# Patient Record
Sex: Male | Born: 1960 | Race: White | Hispanic: No | Marital: Married | Smoking: Former smoker
Health system: Southern US, Community
[De-identification: ages and names within clinical notes are randomized; demographics above are authoritative.]

## PROBLEM LIST (undated history)

## (undated) DIAGNOSIS — R011 Cardiac murmur, unspecified: Secondary | ICD-10-CM

## (undated) DIAGNOSIS — K219 Gastro-esophageal reflux disease without esophagitis: Secondary | ICD-10-CM

## (undated) HISTORY — PX: CYSTECTOMY: SUR359

## (undated) HISTORY — PX: EXTERNAL EAR SURGERY: SHX627

## (undated) HISTORY — PX: INNER EAR SURGERY: SHX679

---

## 2000-01-21 ENCOUNTER — Encounter: Payer: Self-pay | Admitting: Urology

## 2000-01-21 ENCOUNTER — Ambulatory Visit (HOSPITAL_COMMUNITY): Admission: RE | Admit: 2000-01-21 | Discharge: 2000-01-21 | Payer: Self-pay | Admitting: Urology

## 2004-11-04 ENCOUNTER — Ambulatory Visit (HOSPITAL_COMMUNITY): Admission: RE | Admit: 2004-11-04 | Discharge: 2004-11-04 | Payer: Self-pay | Admitting: Family Medicine

## 2004-11-09 ENCOUNTER — Ambulatory Visit (HOSPITAL_COMMUNITY): Admission: RE | Admit: 2004-11-09 | Discharge: 2004-11-09 | Payer: Self-pay | Admitting: Family Medicine

## 2004-12-31 ENCOUNTER — Observation Stay (HOSPITAL_COMMUNITY): Admission: EM | Admit: 2004-12-31 | Discharge: 2005-01-01 | Payer: Self-pay | Admitting: Emergency Medicine

## 2005-01-04 ENCOUNTER — Ambulatory Visit (HOSPITAL_COMMUNITY): Admission: RE | Admit: 2005-01-04 | Discharge: 2005-01-04 | Payer: Self-pay | Admitting: Family Medicine

## 2005-02-08 ENCOUNTER — Ambulatory Visit: Payer: Self-pay | Admitting: Internal Medicine

## 2005-02-10 ENCOUNTER — Ambulatory Visit (HOSPITAL_COMMUNITY): Admission: RE | Admit: 2005-02-10 | Discharge: 2005-02-10 | Payer: Self-pay | Admitting: Internal Medicine

## 2005-02-10 ENCOUNTER — Ambulatory Visit: Payer: Self-pay | Admitting: Internal Medicine

## 2005-02-11 ENCOUNTER — Ambulatory Visit (HOSPITAL_COMMUNITY): Admission: RE | Admit: 2005-02-11 | Discharge: 2005-02-11 | Payer: Self-pay | Admitting: Internal Medicine

## 2005-03-11 ENCOUNTER — Ambulatory Visit: Payer: Self-pay | Admitting: Internal Medicine

## 2006-02-10 ENCOUNTER — Ambulatory Visit (HOSPITAL_COMMUNITY): Admission: RE | Admit: 2006-02-10 | Discharge: 2006-02-10 | Payer: Self-pay | Admitting: Family Medicine

## 2006-09-07 ENCOUNTER — Ambulatory Visit (HOSPITAL_COMMUNITY): Admission: RE | Admit: 2006-09-07 | Discharge: 2006-09-07 | Payer: Self-pay | Admitting: Family Medicine

## 2007-10-11 ENCOUNTER — Ambulatory Visit (HOSPITAL_COMMUNITY): Admission: RE | Admit: 2007-10-11 | Discharge: 2007-10-11 | Payer: Self-pay | Admitting: Family Medicine

## 2008-05-15 ENCOUNTER — Ambulatory Visit (HOSPITAL_COMMUNITY): Admission: RE | Admit: 2008-05-15 | Discharge: 2008-05-15 | Payer: Self-pay | Admitting: Family Medicine

## 2008-05-28 ENCOUNTER — Encounter (HOSPITAL_COMMUNITY): Admission: RE | Admit: 2008-05-28 | Discharge: 2008-06-02 | Payer: Self-pay | Admitting: Family Medicine

## 2009-12-01 ENCOUNTER — Ambulatory Visit (HOSPITAL_COMMUNITY): Admission: RE | Admit: 2009-12-01 | Discharge: 2009-12-01 | Payer: Self-pay | Admitting: Family Medicine

## 2011-01-21 NOTE — Consult Note (Signed)
Jesse Evans, Jesse Evans              ACCOUNT NO.:  [id number]   MEDICAL RECORD NO.:  [id number]           PATIENT TYPE:  AMB   LOCATION:  DAY                           FACILITY:  [facility]   PHYSICIAN:  R. Roetta Sessions, M.D. DATE OF BIRTH:  [birth year]   DATE OF CONSULTATION:  02/08/2005  DATE OF DISCHARGE:                                   CONSULTATION   REQUESTING PHYSICIAN:  Kirk Ruths, M.D.   REASON FOR CONSULTATION:  Abdominal pain.   HISTORY OF PRESENT ILLNESS:  Mr. Lattin is a 50 year old gentleman with  several-month history of epigastric/right upper quadrant abdominal pain.  A  couple of months ago, the pain became more severe.  He was actually  hospitalized for further evaluation.  At that time he had abdominal  ultrasound which revealed splenic granulomas, no renal stones.  The common  bile duct was 4.4 mm in diameter.  Gallbladder appeared to be normal.  He  had a non-contrast CT which was unremarkable.  He has since had a negative  HIDA scan with gallbladder ejection fraction of 89%.  LFTs have been normal.  He has been noted to have some elevated glucose levels up into the 180s.  CBC was normal.  TSH normal at 0.398.  He says he has a history of  gastroesophageal reflux disease, but this seems to be well controlled.  He  is on Zantac 150 mg daily.  Recently was given a 3-week trial of Nexium, but  he noted no improvement of his symptoms.  He had an upper endoscopy about  eight or nine years ago in [place].  He was told he had esophageal  erosions.  He never really had typical heartburn symptoms.  He tends to get  burning in the epigastrium.  He has had some discomfort just right of the  epigastrium and in the right upper quadrant.  Sometimes this is related to  meals but not always.  It does not seem to be related to movement.  No  nausea or vomiting.  His bowel movements have been irregular.  He states  that he gets more frequent stools when he takes Zantac.  He has  occasionally  noted scant amount of red blood on the toilet tissue after having multiple  bowel movements.  Denies any melena.   CURRENT MEDICATIONS:  1.  Zantac 150 mg daily.  2.  Fenovent  LA capsules daily.  3.  Ibuprofen usually every other day for headache.   PAST MEDICAL HISTORY:  1.  History of heart murmur.  2.  History of sinusitis.  3.  History of headache.   PAST SURGICAL HISTORY:  1.  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  2.  He has had a pilonidal cyst removed.   FAMILY HISTORY:  Mother had breast cancer diagnosed two years ago, doing  well.  Paternal and maternal grandmothers and paternal sister and brother  all died of cancer, unknown type.   SOCIAL HISTORY:  He is married.  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  He smokes cigars now.  He quit smoking cigarettes five to six years ago.  He  stopped drinking alcohol 15 years ago.   REVIEW OF SYSTEMS:  He has fleeting left wall chest pain which lasts about  45 seconds and occurs once every couple of weeks.  He has had normal EKG per  his report.  Denies any shortness of breath or diaphoresis associated with  this. CONSTITUTIONAL:  Denies any weight loss.  GENITOURINARY:  Denies gross  hematuria or dysuria.   PHYSICAL EXAMINATION:  VITAL SIGNS:  Weight 171, height 5 feet 5-1/2 inches.  Temperature 98.2, blood pressure 120/78, pulse 84.  GENERAL:  Pleasant, well-nourished, well-developed Caucasian male in no  acute distress.  SKIN:  Warm and dry, no jaundice.  HEENT:  Pupils equal, round, and reactive to light. Conjunctivae pink.  Sclerae nonicteric.  Oropharyngeal mucosa moist and pink.  No lesion,  erythema, or exudate.  NECK:  No lymphadenopathy or thyromegaly.  CHEST:  Lungs clear to auscultation.  CARDIAC:  Regular rate and rhythm, normal S1 and S2.  No murmurs, rubs, or  gallops.  ABDOMEN:  Positive bowel sounds.  Soft,  nondistended.  He  has mild  epigastric/right upper quadrant tenderness to deep palpation.  No  organomegaly or masses.  No rebound tenderness or guarding.  Negative  Murphy's sign. No abdominal bruits or hernias.  EXTREMITIES:  No edema.   IMPRESSION:  The patient is a 50 year old gentleman with several-month  history of epigastric/right upper quadrant abdominal pain sometimes related  to meals.  He had an unremarkable HIDA scan and abdominal ultrasound with  normal liver function tests.  It appears that his symptoms are not related  to biliary etiology.  He does have a history of chronic gastroesophageal  reflux disease, although typical symptoms appear to be controlled.  Cannot  exclude peptic ulcer disease.  He has intermittent diarrhea which he feels  is related to Zantac.  Occasionally sees scant amount of bright red blood on  the toilet tissue.  The patient does not wish workup for this at this time.  He is concerned about the elevated glucose that he had during his  hospitalization.   PLAN:  1.  EGD in the near future.  2.  LFTs, MET-7, hemoglobin A1C.  3.  Stop Zantac.  Try PPI, either [place] or Aciphex whichever samples we      have available, 1 daily, #20 to be given.  4.  If upper endoscopy is unremarkable, would consider contrast CT as next      step.       LL/MEDQ  D:  02/08/2005  T:  02/08/2005  Job:  [id number]   cc:   Kirk Ruths, M.D.  [address]  [place]  [place] [address]  Fax: [phone]

## 2011-01-21 NOTE — H&P (Signed)
NAMEROSCOE, WITTS              ACCOUNT NO.:  [id number]   MEDICAL RECORD NO.:  [id number]          PATIENT TYPE:  INP   LOCATION:  A332                          FACILITY:  [facility]   PHYSICIAN:  Kirk Ruths, M.D.DATE OF BIRTH:  1961-06-10   DATE OF ADMISSION:  12/31/2004  DATE OF DISCHARGE:  LH                                HISTORY & PHYSICAL   CHIEF COMPLAINT:  Right-sided pain.   PRESENTING ILLNESS:  This is a 50 year old male who developed pain from his  right diaphragm down into his right leg on the day of admission. The patient  presented to the office where his vitals seemed normal. The patient had this  unexplainable pain with completely normal exam with only some mild abdominal  tenderness in the right upper quadrant with good distal pulses. The patient  in the office having had a normal exam, we were concerned about possible  aneurysm, and the etiology of his pain, so he was sent to the emergency room  where he was worked up with CT scan which was normal. His white count was  slightly elevated at 13.6, normal hemoglobin of 14.7, platelets and  electrolytes were normal although he does have a mildly elevated glucose  which is new. When in the emergency room, the patient's pain in the right  leg resolved, feeling it may have been related to his low back strain. He  did seem to have a little right upper and lower quadrant tenderness. He is  admitted for further evaluation of his pain, possibly gallbladder disease.   PAST MEDICAL HISTORY:  He has had surgery for a cyst and some ENT surgery.   MEDICATIONS:  He takes no medications except prednisone, Zantac.   REVIEW OF SYSTEMS:  Denies nausea, vomiting, chest pain, diaphoresis.   PHYSICAL EXAMINATION:  GENERAL:  Well developed, well nourished male who  appears fairly comfortable.  VITAL SIGNS:  Temperature 97.3, blood pressure 130/70, respirations 20,  pulse 86 and regular.  HEENT:  Tympanic membranes normal. Pupils  are equal, round, reactive to  light and accommodation. Oropharynx benign.  NECK:  Supple without jugular venous distention, bruit, or thyromegaly.  LUNGS:  Clear.  HEART:  Regular rate and rhythm without murmur, gallop, or rub.  ABDOMEN:  Soft with some mild right upper and right lower quadrant  tenderness. No rebound.  EXTREMITIES:  Without clubbing, cyanosis, or edema. Good distal pulses.  NEUROLOGIC:  Grossly intact.   ASSESSMENT:  1.  Abdominal pain of unknown etiology.      WMM/MEDQ  D:  01/01/2005  T:  01/01/2005  Job:  [id number]

## 2011-01-21 NOTE — Op Note (Signed)
Jesse Evans, LOUGHMILLER              ACCOUNT NO.:  [id number]   MEDICAL RECORD NO.:  [id number]          PATIENT TYPE:  AMB   LOCATION:  DAY                           FACILITY:  [facility]   PHYSICIAN:  R. Roetta Sessions, M.D. DATE OF BIRTH:  11-Jan-1961   DATE OF PROCEDURE:  02/10/2005  DATE OF DISCHARGE:                                 OPERATIVE REPORT   INDICATIONS FOR PROCEDURE:  A 50 year old gentleman with several month  history of intermittent epigastric and right upper quadrant abdominal pain,  sometimes related to meal, sometimes not. Gallbladder ultrasound and HIDA  negative. He was started on Aciphex 20 mg orally daily back on February 08, 2005.  Cannot tell at this point whether it is making much difference. EGD is now  being done to further evaluate his symptoms. Approach has been discussed  with patient at length. Potential risks, benefits, and alternatives have  been reviewed and questions answered. He is agreeable. Please see  documentation in the medical record.   PROCEDURE NOTE:  O2 saturation, blood pressure, pulse, and respirations were  monitored throughout the entire procedure. Conscious sedation with Versed 3  mg IV and Demerol 50 mg IV in divided doses. Cetacaine spray for topical  oropharyngeal anesthesia.   INSTRUMENT:  Olympus video chip system.   FINDINGS:  Examination of the tubular esophagus revealed somewhat patulous  EG junction and a couple of tiny distal esophageal erosions. Esophageal  mucosa otherwise appeared normal. EG junction easily traversed.   Stomach:  Gastric cavity was empty and insufflated well with air. Thorough  examination of gastric mucosa including retroflexed view of the proximal  stomach and esophagogastric junction demonstrated a couple of tiny antral  erosions. Otherwise the gastric mucosa appeared entirely normal. Pylorus was  patent and easily traversed. Examination of bulb and second portion revealed  no abnormalities.   THERAPEUTIC/DIAGNOSTIC MANEUVERS:  None.   IMPRESSION:  A couple of tiny distal esophageal erosions, patulous  esophagogastric junction, otherwise normal esophagus. A couple of tiny  antral erosions, otherwise normal stomach, normal D1 and D2.   Today's endoscopy does not explain the patient's symptoms. He may have an  element of gastroesophageal reflux. I examined him again today, and he has  much tenderness over his right lower anterior rib cage than he does right  upper abdominal quadrant. Labs from February 08, 2005:  Liver profile entirely  normal. MET7 okay except for slightly suppressed sodium of 133. Hemoglobin  A1c 5.6.   RECOMMENDATIONS:  Continue Aciphex 20 mg orally daily. Will go ahead and  proceed with abdominal and chest CT scan with IV normal contrast to further  evaluate his symptoms. Further recommendations to follow.   ADDENDUM:  Mild gastric erosions may likely be ibuprofen effect. Further  recommendations to follow.       RMR/MEDQ  D:  02/10/2005  T:  02/10/2005  Job:  [id number]   cc:   Kirk Ruths, M.D.  [address]  [place]  [place] [address]  Fax: [phone]

## 2012-03-01 ENCOUNTER — Telehealth: Payer: Self-pay | Admitting: Internal Medicine

## 2012-03-01 NOTE — Telephone Encounter (Signed)
Patient's wife Misty Stanley) called to set up TCS for her husband, He is having no problems and wants to be triaged. Please call her at [phone]

## 2012-03-01 NOTE — Telephone Encounter (Signed)
LMOM to call.

## 2012-03-05 NOTE — Telephone Encounter (Signed)
Called and spoke to wife Misty Stanley. Pt had EGD 02/10/2005 by RMR. He needs his first colonoscopy. Not having any problems. Wants a Friday as late as possible. I made her aware that he will need the day before off to do the prep and the day of to do the procedure. Pt cannot do on 03/09/2012. Dr. Jena Gauss unavailable the remainder of the month on Fridays. I do not have the August schedule. Will call Misty Stanley back when I get the August schedule.

## 2012-03-20 ENCOUNTER — Telehealth: Payer: Self-pay

## 2012-03-20 ENCOUNTER — Other Ambulatory Visit: Payer: Self-pay

## 2012-03-20 DIAGNOSIS — Z139 Encounter for screening, unspecified: Secondary | ICD-10-CM

## 2012-03-20 NOTE — Telephone Encounter (Signed)
Gastroenterology Pre-Procedure Form     Request Date: 05/04/2012     Requesting Physician: Dr. Regino Schultze     PATIENT INFORMATION:  Jesse Evans is a 51 y.o., male (DOB=[birth year]).  PROCEDURE: Procedure(s) requested: colonoscopy Procedure Reason: screening for colon cancer  PATIENT REVIEW QUESTIONS: The patient reports the following:   1. Diabetes Melitis: no 2. Joint replacements in the past 12 months: no 3. Major health problems in the past 3 months: no 4. Has an artificial valve or MVP:no 5. Has been advised in past to take antibiotics in advance of a procedure like teeth cleaning: no}    MEDICATIONS & ALLERGIES:    Patient reports the following regarding taking any blood thinners:   Plavix? no Aspirin?yes  Coumadin?  no  Patient confirms/reports the following medications:  Current Outpatient Prescriptions  Medication Sig Dispense Refill  . ibuprofen (ADVIL,MOTRIN) 200 MG tablet Take 200 mg by mouth every 6 (six) hours as needed. Only as needed      . ranitidine (ZANTAC) 150 MG tablet Take 150 mg by mouth 2 (two) times daily.       Pt takes ASA 325 mg   One tablet every other day  Patient confirms/reports the following allergies:  No Known Allergies  Patient is appropriate to schedule for requested procedure(s): yes  AUTHORIZATION INFORMATION Primary Insurance:   ID #:   Group #:  Pre-Cert / Auth required:  Pre-Cert / Auth #:   Secondary Insurance:   ID #:   Group #:  Pre-Cert / Auth required: Pre-Cert / Auth #:   No orders of the defined types were placed in this encounter.    SCHEDULE INFORMATION: Procedure has been scheduled as follows:  Date: 05/04/2012     Time: 8:15 AM  Location: [facility] Short Stay  This Gastroenterology Pre-Precedure Form is being routed to the following provider(s) for review: R. Roetta Sessions, MD

## 2012-03-20 NOTE — Telephone Encounter (Signed)
Pt is scheduled for 05/04/2012 at 8:15 with RMR. See separate triage phone call.

## 2012-03-20 NOTE — Telephone Encounter (Signed)
Rx and instructions mailed to pt.  

## 2012-03-20 NOTE — Telephone Encounter (Signed)
OK to schedule

## 2012-04-16 ENCOUNTER — Telehealth: Payer: Self-pay

## 2012-04-16 NOTE — Telephone Encounter (Signed)
LMOM for pt or wife, Misty Stanley to call to update triage. ( Scheduled for 05/04/2012 at 8:15 AM with RMR.

## 2012-04-16 NOTE — Telephone Encounter (Signed)
Pt's wife called back. Please return her call. Thanks.

## 2012-04-17 NOTE — Telephone Encounter (Signed)
Called and spoke with pt's wife to update triage. He has not had any new problems and no change in medications. He is scheduled for 05/04/2012 @ 8:15 AM with RMR for colonoscopy. He has received his prescription and instructions.

## 2012-04-17 NOTE — Telephone Encounter (Signed)
OK to schedule

## 2012-04-18 ENCOUNTER — Encounter (HOSPITAL_COMMUNITY): Payer: Self-pay | Admitting: Pharmacy Technician

## 2012-05-04 ENCOUNTER — Ambulatory Visit (HOSPITAL_COMMUNITY)
Admission: RE | Admit: 2012-05-04 | Discharge: 2012-05-04 | Disposition: A | Discharge status: Home / Self Care | Payer: PRIVATE HEALTH INSURANCE | Source: Ambulatory Visit | Attending: Internal Medicine | Admitting: Internal Medicine

## 2012-05-04 ENCOUNTER — Encounter (HOSPITAL_COMMUNITY)
Admission: RE | Disposition: A | Discharge status: Home / Self Care | Payer: Self-pay | Source: Ambulatory Visit | Attending: Internal Medicine

## 2012-05-04 ENCOUNTER — Encounter (HOSPITAL_COMMUNITY): Payer: Self-pay | Admitting: *Deleted

## 2012-05-04 DIAGNOSIS — Z139 Encounter for screening, unspecified: Secondary | ICD-10-CM

## 2012-05-04 DIAGNOSIS — Z1211 Encounter for screening for malignant neoplasm of colon: Secondary | ICD-10-CM | POA: Insufficient documentation

## 2012-05-04 DIAGNOSIS — K648 Other hemorrhoids: Secondary | ICD-10-CM

## 2012-05-04 HISTORY — PX: COLONOSCOPY: SHX5424

## 2012-05-04 HISTORY — DX: Gastro-esophageal reflux disease without esophagitis: K21.9

## 2012-05-04 HISTORY — DX: Cardiac murmur, unspecified: R01.1

## 2012-05-04 SURGERY — COLONOSCOPY
Anesthesia: Moderate Sedation

## 2012-05-04 MED ORDER — MIDAZOLAM HCL 5 MG/5ML IJ SOLN
INTRAMUSCULAR | Status: DC | PRN
  Administered 2012-05-04 (×2): 2 mg via INTRAVENOUS

## 2012-05-04 MED ORDER — MEPERIDINE HCL 100 MG/ML IJ SOLN
INTRAMUSCULAR | Status: DC | PRN
  Administered 2012-05-04: 25 mg via INTRAVENOUS
  Administered 2012-05-04: 50 mg via INTRAVENOUS

## 2012-05-04 MED ORDER — MEPERIDINE HCL 100 MG/ML IJ SOLN
INTRAMUSCULAR | Status: AC
  Filled 2012-05-04: qty 2

## 2012-05-04 MED ORDER — MIDAZOLAM HCL 5 MG/5ML IJ SOLN
INTRAMUSCULAR | Status: AC
  Filled 2012-05-04: qty 10

## 2012-05-04 MED ORDER — STERILE WATER FOR IRRIGATION IR SOLN
Status: DC | PRN
  Administered 2012-05-04: 09:00:00

## 2012-05-04 MED ORDER — SODIUM CHLORIDE 0.45 % IV SOLN
Freq: Once | INTRAVENOUS | Status: AC
  Administered 2012-05-04: 1000 mL via INTRAVENOUS

## 2012-05-04 NOTE — Op Note (Signed)
[facility] [address] [place], [address]   COLONOSCOPY PROCEDURE REPORT  PATIENT: Jesse Evans, Jesse Evans  MR#:         [id number] BIRTHDATE: [birth year] , 51  yrs. old GENDER: Male ENDOSCOPIST: R.  Roetta Sessions, MD FACP FACG REFERRED BY:  Karleen Hampshire, M.D. PROCEDURE DATE:  05/04/2012 PROCEDURE:     screening ileocolonoscopy  INDICATIONS: first ever average risk screening examination  INFORMED CONSENT:  The risks, benefits, alternatives and imponderables including but not limited to bleeding, perforation as well as the possibility of a missed lesion have been reviewed.  The potential for biopsy, lesion removal, etc. have also been discussed.  Questions have been answered.  All parties agreeable. Please see the history and physical in the medical record for more information.  MEDICATIONS: Versed 4 mg IV and Demerol 75 mg IV in divided doses.  DESCRIPTION OF PROCEDURE:  After a digital rectal exam was performed, the Pentax Colonoscope [id number]  colonoscope was advanced from the anus through the rectum and colon to the area of the cecum, ileocecal valve and appendiceal orifice.  The cecum was deeply intubated.  These structures were well-seen and photographed for the record.  From the level of the cecum and ileocecal valve, the scope was slowly and cautiously withdrawn.  The mucosal surfaces were carefully surveyed utilizing scope tip deflection to facilitate fold flattening as needed.  The scope was pulled down into the rectum where a thorough examination including retroflexion was performed.    FINDINGS:  adequate preparation. Internal hemorrhoids; otherwise, normal rectum. Normal appearing colonic mucosa. normal appearing distal 10 cm of terminal ileal mucosa.  THERAPEUTIC / DIAGNOSTIC MANEUVERS PERFORMED:  none  COMPLICATIONS: none  CECAL WITHDRAWAL TIME:  12 minutes  IMPRESSION:  internal hemorrhoids; otherwise normal rectum colon and terminal  ileum.  RECOMMENDATIONS: repeat screening colonoscopy in 10 years.   _______________________________ eSigned:  R. Roetta Sessions, MD FACP [facility] 05/04/2012 9:07 AM   CC:

## 2012-05-04 NOTE — H&P (Signed)
  Primary Care Physician:  Kirk Ruths, MD Primary Gastroenterologist:  Dr. Jena Gauss  Pre-Procedure History & Physical: HPI:  Jesse Evans is a 52 y.o. male is here for a screening colonoscopy. No bowel symptoms. No family history of colon polyps or colon cancer. No prior colonoscopy.  Past Medical History  Diagnosis Date  . GERD (gastroesophageal reflux disease)   . Heart murmur     Past Surgical History  Procedure Date  . Inner ear surgery     As a child    Prior to Admission medications   Medication Sig Start Date End Date Taking? Authorizing Provider  ibuprofen (ADVIL,MOTRIN) 200 MG tablet Take 200 mg by mouth every 6 (six) hours as needed. Only as needed   Yes Historical Provider, MD  aspirin 325 MG tablet Take 325 mg by mouth daily. Pt takes one tablet every other day    Historical Provider, MD  ranitidine (ZANTAC) 150 MG tablet Take 150 mg by mouth 2 (two) times daily.    Historical Provider, MD    Allergies as of 03/20/2012  . (No Known Allergies)    History reviewed. No pertinent family history.  History   Social History  . Marital Status: Married    Spouse Name: N/A    Number of Children: N/A  . Years of Education: N/A   Occupational History  . Not on file.   Social History Main Topics  . Smoking status: Former Smoker -- 1.0 packs/day for 18 years    Types: Cigarettes  . Smokeless tobacco: Not on file  . Alcohol Use: No  . Drug Use: No  . Sexually Active:    Other Topics Concern  . Not on file   Social History Narrative  . No narrative on file    Review of Systems: See HPI, otherwise negative ROS  Physical Exam: BP 126/86  Pulse 69  Temp 97.7 F (36.5 C) (Oral)  Resp 18  Ht 5\' 5"  (1.651 m)  Wt 165 lb (74.844 kg)  BMI 27.46 kg/m2  SpO2 98% General:   Alert,  Well-developed, well-nourished, pleasant and cooperative in NAD Head:  Normocephalic and atraumatic. Eyes:  Sclera clear, no icterus.   Conjunctiva pink. Ears:  Normal  auditory acuity. Nose:  No deformity, discharge,  or lesions. Mouth:  No deformity or lesions, dentition normal. Neck:  Supple; no masses or thyromegaly. Lungs:  Clear throughout to auscultation.   No wheezes, crackles, or rhonchi. No acute distress. Heart:  Regular rate and rhythm; no murmurs, clicks, rubs,  or gallops. Abdomen:  Soft, nontender and nondistended. No masses, hepatosplenomegaly or hernias noted. Normal bowel sounds, without guarding, and without rebound.   Msk:  Symmetrical without gross deformities. Normal posture. Pulses:  Normal pulses noted. Extremities:  Without clubbing or edema. Neurologic:  Alert and  oriented x4;  grossly normal neurologically. Skin:  Intact without significant lesions or rashes. Cervical Nodes:  No significant cervical adenopathy. Psych:  Alert and cooperative. Normal mood and affect.  Impression/Plan: Jesse Evans is now here to undergo a screening colonoscopy. First ever average risk screening examination  Risks, benefits, limitations, imponderables and alternatives regarding colonoscopy have been reviewed with the patient. Questions have been answered. All parties agreeable.

## 2012-05-11 ENCOUNTER — Encounter (HOSPITAL_COMMUNITY): Payer: Self-pay | Admitting: Internal Medicine

## 2012-07-15 ENCOUNTER — Encounter (HOSPITAL_COMMUNITY): Payer: Self-pay | Admitting: Emergency Medicine

## 2012-07-15 ENCOUNTER — Emergency Department (HOSPITAL_COMMUNITY)
Admission: EM | Admit: 2012-07-15 | Discharge: 2012-07-15 | Disposition: A | Discharge status: Home / Self Care | Payer: PRIVATE HEALTH INSURANCE | Attending: Emergency Medicine | Admitting: Emergency Medicine

## 2012-07-15 DIAGNOSIS — Z79899 Other long term (current) drug therapy: Secondary | ICD-10-CM | POA: Insufficient documentation

## 2012-07-15 DIAGNOSIS — Z87891 Personal history of nicotine dependence: Secondary | ICD-10-CM | POA: Insufficient documentation

## 2012-07-15 DIAGNOSIS — R011 Cardiac murmur, unspecified: Secondary | ICD-10-CM | POA: Insufficient documentation

## 2012-07-15 DIAGNOSIS — Y929 Unspecified place or not applicable: Secondary | ICD-10-CM | POA: Insufficient documentation

## 2012-07-15 DIAGNOSIS — Z7982 Long term (current) use of aspirin: Secondary | ICD-10-CM | POA: Insufficient documentation

## 2012-07-15 DIAGNOSIS — K219 Gastro-esophageal reflux disease without esophagitis: Secondary | ICD-10-CM | POA: Insufficient documentation

## 2012-07-15 DIAGNOSIS — IMO0002 Reserved for concepts with insufficient information to code with codable children: Secondary | ICD-10-CM | POA: Insufficient documentation

## 2012-07-15 DIAGNOSIS — Y9389 Activity, other specified: Secondary | ICD-10-CM | POA: Insufficient documentation

## 2012-07-15 DIAGNOSIS — S0001XA Abrasion of scalp, initial encounter: Secondary | ICD-10-CM

## 2012-07-15 MED ORDER — METHOCARBAMOL 500 MG PO TABS: 1000.0000 mg | ORAL_TABLET | Freq: Four times a day (QID) | ORAL | Status: AC | PRN
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
## 2012-07-18 NOTE — ED Provider Notes (Signed)
Medical screening examination/treatment/procedure(s) were performed by non-physician practitioner and as supervising physician I was immediately available for consultation/collaboration.   Laray Anger, DO 07/18/12 1326

## 2015-05-26 ENCOUNTER — Emergency Department (HOSPITAL_COMMUNITY)
Admission: EM | Admit: 2015-05-26 | Discharge: 2015-05-26 | Disposition: A | Discharge status: Home / Self Care | Payer: 59 | Attending: Emergency Medicine | Admitting: Emergency Medicine

## 2015-05-26 ENCOUNTER — Encounter (HOSPITAL_COMMUNITY): Payer: Self-pay | Admitting: Emergency Medicine

## 2015-05-26 DIAGNOSIS — Z87891 Personal history of nicotine dependence: Secondary | ICD-10-CM | POA: Diagnosis not present

## 2015-05-26 DIAGNOSIS — J039 Acute tonsillitis, unspecified: Secondary | ICD-10-CM | POA: Diagnosis not present

## 2015-05-26 DIAGNOSIS — R011 Cardiac murmur, unspecified: Secondary | ICD-10-CM | POA: Insufficient documentation

## 2015-05-26 DIAGNOSIS — Z7982 Long term (current) use of aspirin: Secondary | ICD-10-CM | POA: Insufficient documentation

## 2015-05-26 DIAGNOSIS — K219 Gastro-esophageal reflux disease without esophagitis: Secondary | ICD-10-CM | POA: Insufficient documentation

## 2015-05-26 DIAGNOSIS — M791 Myalgia: Secondary | ICD-10-CM | POA: Insufficient documentation

## 2015-05-26 DIAGNOSIS — Z79899 Other long term (current) drug therapy: Secondary | ICD-10-CM | POA: Insufficient documentation

## 2015-05-26 DIAGNOSIS — R51 Headache: Secondary | ICD-10-CM | POA: Diagnosis present

## 2015-05-26 MED ORDER — PENICILLIN V POTASSIUM 125 MG/5ML PO SOLR: 500.0000 mg | Freq: Four times a day (QID) | ORAL | Status: AC

## 2015-05-26 NOTE — ED Notes (Addendum)
Pt reports headache,right ear pain,sore throat since yesterday. Pt reports diarrhea since this am. Pt denies any known fevers.

## 2015-05-26 NOTE — ED Provider Notes (Signed)
CSN: [id number]     Arrival date & time 05/26/15  1640 History   First MD Initiated Contact with Patient 05/26/15 1919     Chief Complaint  Patient presents with  . Headache     (Consider location/radiation/quality/duration/timing/severity/associated sxs/prior Treatment) HPI   Jesse Evans is a 54 y.o. male who presents for evaluation of sore throat for 3 days with pain radiating to the right ear, malaise and myalgia. No weakness, dizziness, nausea, vomiting, cough, chest pain or back pain.     Past Medical History  Diagnosis Date  . GERD (gastroesophageal reflux disease)   . Heart murmur    Past Surgical History  Procedure Laterality Date  . Inner ear surgery      As a child  . Colonoscopy  05/04/2012    Procedure: COLONOSCOPY;  Surgeon: Corbin Ade, MD;  [facility];  Service: Endoscopy;  Laterality: N/A;  8:15 AM  . External ear surgery    . Cystectomy     History reviewed. No pertinent family history. Social History  Substance Use Topics  . Smoking status: Former Smoker -- 1.00 packs/day for 18 years    Types: Cigarettes  . Smokeless tobacco: None  . Alcohol Use: No    Review of Systems  All other systems reviewed and are negative.     Allergies  Review of patient's allergies indicates no known allergies.  Home Medications   Prior to Admission medications   Medication Sig Start Date End Date Taking? Authorizing Provider  aspirin 325 MG tablet Take 325 mg by mouth daily. Pt takes one tablet every other day    Historical Provider, MD  ibuprofen (ADVIL,MOTRIN) 200 MG tablet Take 200 mg by mouth every 6 (six) hours as needed. Only as needed(for pain)    Historical Provider, MD  ranitidine (ZANTAC) 150 MG tablet Take 150 mg by mouth 2 (two) times daily.    Historical Provider, MD   BP 148/78 mmHg  Pulse 93  Temp(Src) 98.1 F (36.7 C) (Oral)  Resp 18  Ht 5' 5.5" (1.664 m)  Wt 190 lb (86.183 kg)  BMI 31.13 kg/m2  SpO2 100% Physical Exam   Constitutional: He is oriented to person, place, and time. He appears well-developed and well-nourished. No distress.  HENT:  Head: Normocephalic and atraumatic.  Right Ear: External ear normal.  Left Ear: External ear normal.  Bilateral tonsillar hypertrophy with exudate. No deviation of the uvula. No trismus.  Eyes: Conjunctivae and EOM are normal. Pupils are equal, round, and reactive to light.  Neck: Normal range of motion and phonation normal. Neck supple.  Cardiovascular: Normal rate, regular rhythm and normal heart sounds.   Pulmonary/Chest: Effort normal and breath sounds normal. He exhibits no bony tenderness.  Abdominal: Soft. There is no tenderness.  Musculoskeletal: Normal range of motion.  Neurological: He is alert and oriented to person, place, and time. No cranial nerve deficit or sensory deficit. He exhibits normal muscle tone. Coordination normal.  Skin: Skin is warm, dry and intact.  Psychiatric: He has a normal mood and affect. His behavior is normal. Judgment and thought content normal.  Nursing note and vitals reviewed.   ED Course  Procedures (including critical care time) Findings discussed with patient and wife, all questions were answered.  Labs Review Labs Reviewed - No data to display  Imaging Review No results found. I have personally reviewed and evaluated these images and lab results as part of my medical decision-making.   EKG  Interpretation None      MDM   Final diagnoses:  Tonsillitis      Evaluation is consistent with streptococcal tonsillitis. Doubt systemic illness.  Nursing Notes Reviewed/ Care Coordinated Applicable Imaging Reviewed Interpretation of Laboratory Data incorporated into ED treatment  The patient appears reasonably screened and/or stabilized for discharge and I doubt any other medical condition or other [facility] requiring further screening, evaluation, or treatment in the ED at this time prior to discharge.  Plan: Home  Medications- Pen V; Home Treatments- rest; return here if the recommended treatment, does not improve the symptoms; Recommended follow up- PCP prn     Mancel Bale, MD 05/26/15 1931

## 2015-05-26 NOTE — Discharge Instructions (Signed)

## 2017-11-16 ENCOUNTER — Ambulatory Visit (HOSPITAL_COMMUNITY)
Admission: RE | Admit: 2017-11-16 | Discharge: 2017-11-16 | Disposition: A | Discharge status: Home / Self Care | Payer: 59 | Source: Ambulatory Visit | Attending: Physician Assistant | Admitting: Physician Assistant

## 2017-11-16 ENCOUNTER — Other Ambulatory Visit (HOSPITAL_COMMUNITY): Payer: Self-pay | Admitting: Physician Assistant

## 2017-11-16 DIAGNOSIS — R0602 Shortness of breath: Secondary | ICD-10-CM | POA: Diagnosis not present

## 2020-07-09 ENCOUNTER — Other Ambulatory Visit (HOSPITAL_COMMUNITY): Payer: Self-pay | Admitting: Physician Assistant

## 2020-07-09 DIAGNOSIS — R0789 Other chest pain: Secondary | ICD-10-CM

## 2020-07-09 DIAGNOSIS — R0609 Other forms of dyspnea: Secondary | ICD-10-CM

## 2020-07-13 ENCOUNTER — Other Ambulatory Visit: Payer: Self-pay

## 2020-07-13 ENCOUNTER — Ambulatory Visit (HOSPITAL_COMMUNITY)
Admission: RE | Admit: 2020-07-13 | Discharge: 2020-07-13 | Disposition: A | Discharge status: Home / Self Care | Payer: 59 | Source: Ambulatory Visit | Attending: Physician Assistant | Admitting: Physician Assistant

## 2020-07-13 DIAGNOSIS — R0609 Other forms of dyspnea: Secondary | ICD-10-CM | POA: Diagnosis present

## 2020-07-13 DIAGNOSIS — R0789 Other chest pain: Secondary | ICD-10-CM | POA: Diagnosis not present

## 2020-08-12 ENCOUNTER — Ambulatory Visit: Payer: PRIVATE HEALTH INSURANCE | Admitting: Internal Medicine

## 2020-08-25 NOTE — Progress Notes (Deleted)
Cardiology Office Note:   Date:  08/25/2020  NAME:  Jesse Evans    MRN: [id number] DOB:  [birth year]   PCP:  Patient, No Pcp Per  Cardiologist:  No primary care provider on file.  Electrophysiologist:  None   Referring MD: Shawnie Dapper, PA-C   No chief complaint on file. ***  History of Present Illness:   Jesse Evans is a 59 y.o. male with a hx of GERD who is being seen today for the evaluation of shortness of breath at the request of Shawnie Dapper, PA-C.  Past Medical History: Past Medical History:  Diagnosis Date  . GERD (gastroesophageal reflux disease)   . Heart murmur     Past Surgical History: Past Surgical History:  Procedure Laterality Date  . COLONOSCOPY  05/04/2012   Procedure: COLONOSCOPY;  Surgeon: Corbin Ade, MD;  [facility];  Service: Endoscopy;  Laterality: N/A;  8:15 AM  . CYSTECTOMY    . EXTERNAL EAR SURGERY    . INNER EAR SURGERY     As a child    Current Medications: No outpatient medications have been marked as taking for the 08/27/20 encounter (Appointment) with O'Neal, Ronnald Ramp, MD.     Allergies:    Patient has no known allergies.   Social History: Social History   Socioeconomic History  . Marital status: Married    Spouse name: Not on file  . Number of children: Not on file  . Years of education: Not on file  . Highest education level: Not on file  Occupational History  . Not on file  Tobacco Use  . Smoking status: Former Smoker    Packs/day: 1.00    Years: 18.00    Pack years: 18.00    Types: Cigarettes  . Smokeless tobacco: Not on file  Substance and Sexual Activity  . Alcohol use: No  . Drug use: No  . Sexual activity: Not on file  Other Topics Concern  . Not on file  Social History Narrative  . Not on file   Social Determinants of Health   Financial Resource Strain: Not on file  Food Insecurity: Not on file  Transportation Needs: Not on file  Physical Activity: Not on file   Stress: Not on file  Social Connections: Not on file     Family History: The patient's ***family history is not on file.  ROS:   All other ROS reviewed and negative. Pertinent positives noted in the HPI.     EKGs/Labs/Other Studies Reviewed:   The following studies were personally reviewed by me today:  EKG:  EKG is *** ordered today.  The ekg ordered today demonstrates ***, and was personally reviewed by me.   Recent Labs: No results found for requested labs within last 8760 hours.   Recent Lipid Panel No results found for: CHOL, TRIG, HDL, CHOLHDL, VLDL, LDLCALC, LDLDIRECT  Physical Exam:   VS:  There were no vitals taken for this visit.   Wt Readings from Last 3 Encounters:  05/26/15 190 lb (86.2 kg)  07/15/12 169 lb (76.7 kg)  05/04/12 165 lb (74.8 kg)    General: Well nourished, well developed, in no acute distress Head: Atraumatic, normal size  Eyes: PEERLA, EOMI  Neck: Supple, no JVD Endocrine: No thryomegaly Cardiac: Normal S1, S2; RRR; no murmurs, rubs, or gallops Lungs: Clear to auscultation bilaterally, no wheezing, rhonchi or rales  Abd: Soft, nontender, no hepatomegaly  Ext: No edema, pulses 2+ Musculoskeletal:  No deformities, BUE and BLE strength normal and equal Skin: Warm and dry, no rashes   Neuro: Alert and oriented to person, place, time, and situation, CNII-XII grossly intact, no focal deficits  Psych: Normal mood and affect   ASSESSMENT:   Jesse Evans is a 59 y.o. male who presents for the following: No diagnosis found.  PLAN:   There are no diagnoses linked to this encounter.  Disposition: No follow-ups on file.  Medication Adjustments/Labs and Tests Ordered: Current medicines are reviewed at length with the patient today.  Concerns regarding medicines are outlined above.  No orders of the defined types were placed in this encounter.  No orders of the defined types were placed in this encounter.   There are no Patient Instructions  on file for this visit.   Time Spent with Patient: I have spent a total of *** minutes with patient reviewing hospital notes, telemetry, EKGs, labs and examining the patient as well as establishing an assessment and plan that was discussed with the patient.  > 50% of time was spent in direct patient care.  Signed, Lenna Gilford. Flora Lipps, MD [facility]  [address] [place], [place] [address] [phone]  08/25/2020 6:42 AM

## 2020-08-27 ENCOUNTER — Ambulatory Visit: Payer: PRIVATE HEALTH INSURANCE | Admitting: Cardiovascular Disease

## 2020-09-07 NOTE — Progress Notes (Deleted)
Cardiology Office Note:    Date:  09/07/2020   ID:  Jesse Evans, DOB [birth year], MRN [id number]  PCP:  Patient, No Pcp Per  Cardiologist:  No primary care provider on file.  Electrophysiologist:  None   Referring MD: Shawnie Dapper, PA-C   No chief complaint on file. ***  History of Present Illness:    Jesse Evans is a 60 y.o. male with a hx of GERD who is referred by Lenise Herald, PA for evaluation of shortness of breath.  Past Medical History:  Diagnosis Date  . GERD (gastroesophageal reflux disease)   . Heart murmur     Past Surgical History:  Procedure Laterality Date  . COLONOSCOPY  05/04/2012   Procedure: COLONOSCOPY;  Surgeon: Corbin Ade, MD;  [facility];  Service: Endoscopy;  Laterality: N/A;  8:15 AM  . CYSTECTOMY    . EXTERNAL EAR SURGERY    . INNER EAR SURGERY     As a child    Current Medications: No outpatient medications have been marked as taking for the 09/11/20 encounter (Appointment) with Little Ishikawa, MD.     Allergies:   Patient has no known allergies.   Social History   Socioeconomic History  . Marital status: Married    Spouse name: Not on file  . Number of children: Not on file  . Years of education: Not on file  . Highest education level: Not on file  Occupational History  . Not on file  Tobacco Use  . Smoking status: Former Smoker    Packs/day: 1.00    Years: 18.00    Pack years: 18.00    Types: Cigarettes  . Smokeless tobacco: Not on file  Substance and Sexual Activity  . Alcohol use: No  . Drug use: No  . Sexual activity: Not on file  Other Topics Concern  . Not on file  Social History Narrative  . Not on file   Social Determinants of Health   Financial Resource Strain: Not on file  Food Insecurity: Not on file  Transportation Needs: Not on file  Physical Activity: Not on file  Stress: Not on file  Social Connections: Not on file     Family History: The patient's ***family  history is not on file.  ROS:   Please see the history of present illness.    *** All other systems reviewed and are negative.  EKGs/Labs/Other Studies Reviewed:    The following studies were reviewed today: ***  EKG:  EKG is *** ordered today.  The ekg ordered today demonstrates ***  Recent Labs: No results found for requested labs within last 8760 hours.  Recent Lipid Panel No results found for: CHOL, TRIG, HDL, CHOLHDL, VLDL, LDLCALC, LDLDIRECT  Physical Exam:    VS:  There were no vitals taken for this visit.    Wt Readings from Last 3 Encounters:  05/26/15 190 lb (86.2 kg)  07/15/12 169 lb (76.7 kg)  05/04/12 165 lb (74.8 kg)     GEN: *** Well nourished, well developed in no acute distress HEENT: Normal NECK: No JVD; No carotid bruits LYMPHATICS: No lymphadenopathy CARDIAC: ***RRR, no murmurs, rubs, gallops RESPIRATORY:  Clear to auscultation without rales, wheezing or rhonchi  ABDOMEN: Soft, non-tender, non-distended MUSCULOSKELETAL:  No edema; No deformity  SKIN: Warm and dry NEUROLOGIC:  Alert and oriented x 3 PSYCHIATRIC:  Normal affect   ASSESSMENT:    No diagnosis found. PLAN:    Dyspnea:  RTC in***  Medication Adjustments/Labs and Tests Ordered: Current medicines are reviewed at length with the patient today.  Concerns regarding medicines are outlined above.  No orders of the defined types were placed in this encounter.  No orders of the defined types were placed in this encounter.   There are no Patient Instructions on file for this visit.   Signed, Little Ishikawa, MD  09/07/2020 8:25 AM    [facility]

## 2020-09-11 ENCOUNTER — Ambulatory Visit: Payer: PRIVATE HEALTH INSURANCE | Admitting: Cardiology

## 2020-09-27 NOTE — Progress Notes (Signed)
Cardiology Office Note:    Date:  09/30/2020   ID:  Jesse Evans, DOB Aug 02, 1961, MRN [id number]  PCP:  Patient, No Pcp Per  Cardiologist:  No primary care provider on file.  Electrophysiologist:  None   Referring MD: Cory Munch, PA-C   Chief Complaint  Patient presents with  . Shortness of Breath    History of Present Illness:    Jesse Evans is a 60 y.o. male with a hx of GERD, OSA, HLD, former tobacco use who is referred by Collene Mares, PA for evaluation of shortness of breath.  He reports that he has been having chest pain and shortness of breath.  Reports that he gets short of breath with exertion at work, and also has felt lightheaded with significant exertion.  In addition has been having episodes of chest pain.  Describes left-sided tightening in his chest that lasts less than 1 minute.  Not occurring frequently.  Has not noted relationship of chest pain to exertion.  Denies any syncope, lower extremity edema, or palpitations.  Quit smoking in 2001, had smoked 1 pack/day x 22 years.  Family history includes father has CAD and PCI was recommended in his 59s but he declined.    BP Readings from Last 3 Encounters:  09/30/20 (!) 146/76  05/26/15 148/78  07/15/12 151/91    Past Medical History:  Diagnosis Date  . GERD (gastroesophageal reflux disease)   . Heart murmur     Past Surgical History:  Procedure Laterality Date  . COLONOSCOPY  05/04/2012   Procedure: COLONOSCOPY;  Surgeon: Daneil Dolin, MD;  Location: [facility];  Service: Endoscopy;  Laterality: N/A;  8:15 AM  . CYSTECTOMY    . EXTERNAL EAR SURGERY    . INNER EAR SURGERY     As a child    Current Medications: Current Meds  Medication Sig  . amLODipine (NORVASC) 5 MG tablet Take 1 tablet (5 mg total) by mouth daily.  Marland Kitchen atorvastatin (LIPITOR) 40 MG tablet Take 40 mg by mouth daily.  . Cholecalciferol (VITAMIN D-3) 125 MCG (5000 UT) TABS Take 2 tablets by mouth daily.  . fenofibrate  (TRICOR) 145 MG tablet Take 145 mg by mouth daily.  Marland Kitchen FIBER PO Take by mouth daily.  Marland Kitchen ibuprofen (ADVIL,MOTRIN) 200 MG tablet Take 200 mg by mouth every 6 (six) hours as needed. Only as needed(for pain)  . metoprolol tartrate (LOPRESSOR) 100 MG tablet TAKE 1 TABLET 2 HR PRIOR TO CARDIAC PROCEDURE  . omeprazole (PRILOSEC) 40 MG capsule Take 40 mg by mouth daily.  . ranitidine (ZANTAC) 150 MG tablet Take 150 mg by mouth 2 (two) times daily.  . [DISCONTINUED] aspirin 325 MG tablet Take 325 mg by mouth daily. Pt takes one tablet every other day     Allergies:   Patient has no known allergies.   Social History   Socioeconomic History  . Marital status: Married    Spouse name: Not on file  . Number of children: Not on file  . Years of education: Not on file  . Highest education level: Not on file  Occupational History  . Not on file  Tobacco Use  . Smoking status: Former Smoker    Packs/day: 1.00    Years: 18.00    Pack years: 18.00    Types: Cigarettes  . Smokeless tobacco: Former Systems developer    Types: Chew  Substance and Sexual Activity  . Alcohol use: No  . Drug use: No  .  Sexual activity: Not on file  Other Topics Concern  . Not on file  Social History Narrative  . Not on file   Social Determinants of Health   Financial Resource Strain: Not on file  Food Insecurity: Not on file  Transportation Needs: Not on file  Physical Activity: Not on file  Stress: Not on file  Social Connections: Not on file     Family History: Family history includes father has CAD and PCI was recommended in his 70s but he declined.  ROS:   Please see the history of present illness.     All other systems reviewed and are negative.  EKGs/Labs/Other Studies Reviewed:    The following studies were reviewed today:   EKG:  EKG is ordered today.  The ekg ordered today demonstrates normal sinus rhythm, rate 72, no ST abnormalities  Recent Labs: No results found for requested labs within last 8760  hours.  Recent Lipid Panel No results found for: CHOL, TRIG, HDL, CHOLHDL, VLDL, LDLCALC, LDLDIRECT  Physical Exam:    VS:  BP (!) 146/76   Pulse 72   Ht 5' 5.5" (1.664 m)   Wt 193 lb (87.5 kg)   BMI 31.63 kg/m     Wt Readings from Last 3 Encounters:  09/30/20 193 lb (87.5 kg)  05/26/15 190 lb (86.2 kg)  07/15/12 169 lb (76.7 kg)     GEN: Well nourished, well developed in no acute distress HEENT: Normal NECK: No JVD; No carotid bruits LYMPHATICS: No lymphadenopathy CARDIAC: RRR, no murmurs,distant heart sounds RESPIRATORY:  Clear to auscultation without rales, wheezing or rhonchi  ABDOMEN: Soft, non-tender, non-distended MUSCULOSKELETAL:  No edema; No deformity  SKIN: Warm and dry NEUROLOGIC:  Alert and oriented x 3 PSYCHIATRIC:  Normal affect   ASSESSMENT:    1. DOE (dyspnea on exertion)   2. Precordial pain   3. Pre-procedure lab exam   4. Medication management   5. Essential hypertension   6. Hyperlipidemia, unspecified hyperlipidemia type   7. OSA (obstructive sleep apnea)    PLAN:    Chest pain/dyspnea: Chest pain atypical in description but does have CAD risk factors (age, former tobacco use, hypertension, hyperlipidemia).  Also reporting dyspnea with exertion that could represent anginal equivalent. -Recommend coronary CTA for further evaluation.  Will give metoprolol 100 mg prior to study -Echocardiogram  Hyperlipidemia: On atorvastatin 40 mg daily.  Will check lipid panel  Hypertension: BP elevated, has not been on medication.  Will start amlodipine 5 mg daily.  Asked patient to check BP twice daily for next 2 weeks and call with results.  OSA: has not been using CPAP, recommend compliance  RTC in 3 months  Medication Adjustments/Labs and Tests Ordered: Current medicines are reviewed at length with the patient today.  Concerns regarding medicines are outlined above.  Orders Placed This Encounter  Procedures  . CT CORONARY MORPH W/CTA COR W/SCORE  W/CA W/CM &/OR WO/CM  . CT CORONARY FRACTIONAL FLOW RESERVE DATA PREP  . CT CORONARY FRACTIONAL FLOW RESERVE FLUID ANALYSIS  . Basic metabolic panel  . Lipid panel  . EKG 12-Lead  . ECHOCARDIOGRAM COMPLETE   Meds ordered this encounter  Medications  . metoprolol tartrate (LOPRESSOR) 100 MG tablet    Sig: TAKE 1 TABLET 2 HR PRIOR TO CARDIAC PROCEDURE    Dispense:  1 tablet    Refill:  0  . amLODipine (NORVASC) 5 MG tablet    Sig: Take 1 tablet (5 mg total) by mouth daily.  Dispense:  90 tablet    Refill:  3    Patient Instructions  Medication Instructions:  Start Amlodipine 5 mg daily  Please purchase Omron blood pressure machine Check blood pressure twice a day for 2 weeks, then call office with readings.   *If you need a refill on your cardiac medications before your next appointment, please call your pharmacy*   Lab Work: Your physician recommends lab work today (BMP, Lipid).   If you have labs (blood work) drawn today and your tests are completely normal, you will receive your results only by: Marland Kitchen MyChart Message (if you have MyChart) OR . A paper copy in the mail If you have any lab test that is abnormal or we need to change your treatment, we will call you to review the results.   Testing/Procedures: Your physician has requested that you have an echocardiogram. Echocardiography is a painless test that uses sound waves to create images of your heart. It provides your doctor with information about the size and shape of your heart and how well your heart's chambers and valves are working. This procedure takes approximately one hour. There are no restrictions for this procedure. [address]  Cardiac CT Angiography (CTA), is a special type of CT scan that uses a computer to produce multi-dimensional views of major blood vessels throughout the body. In CT angiography, a contrast material is injected through an IV to help visualize the blood  vessels [facility]  Follow-Up: At [facility], you and your health needs are our priority.  As part of our continuing mission to provide you with exceptional heart care, we have created designated Provider Care Teams.  These Care Teams include your primary Cardiologist (physician) and Advanced Practice Providers (APPs -  Physician Assistants and Nurse Practitioners) who all work together to provide you with the care you need, when you need it.  We recommend signing up for the patient portal called "MyChart".  Sign up information is provided on this After Visit Summary.  MyChart is used to connect with patients for Virtual Visits (Telemedicine).  Patients are able to view lab/test results, encounter notes, upcoming appointments, etc.  Non-urgent messages can be sent to your provider as well.   To learn more about what you can do with MyChart, go to [email or web address].    Your next appointment:   3 month(s)  The format for your next appointment:   In Person  Provider:   Oswaldo Milian, MD  Your cardiac CT will be scheduled at one of the below locations:   [facility] [address] [place], [place] [address] [phone]  If scheduled at [facility], please arrive at the [facility] main entrance of [facility] 30 minutes prior to test start time. Proceed to the [facility] Radiology Department (first floor) to check-in and test prep.  Please follow these instructions carefully (unless otherwise directed):  Hold all erectile dysfunction medications at least 3 days (72 hrs) prior to test.  On the Night Before the Test: . Be sure to Drink plenty of water. . Do not consume any caffeinated/decaffeinated beverages or chocolate 12 hours prior to your test. . Do not take any antihistamines 12 hours prior to your test.   On the Day of the Test: . Drink plenty of water. Do not drink any water within one hour of the test. . Do not eat  any food 4 hours prior to the test. . You  may take your regular medications prior to the test.  . Take metoprolol (Lopressor) 100 mg two hours prior to test.        After the Test: . Drink plenty of water. . After receiving IV contrast, you may experience a mild flushed feeling. This is normal. . On occasion, you may experience a mild rash up to 24 hours after the test. This is not dangerous. If this occurs, you can take Benadryl 25 mg and increase your fluid intake. . If you experience trouble breathing, this can be serious. If it is severe call 911 IMMEDIATELY. If it is mild, please call our office. . If you take any of these medications: Glipizide/Metformin, Avandament, Glucavance, please do not take 48 hours after completing test unless otherwise instructed.   Once we have confirmed authorization from your insurance company, we will call you to set up a date and time for your test. Based on how quickly your insurance processes prior authorizations requests, please allow up to 4 weeks to be contacted for scheduling your Cardiac CT appointment. Be advised that routine Cardiac CT appointments could be scheduled as many as 8 weeks after your provider has ordered it.  For non-scheduling related questions, please contact the cardiac imaging nurse navigator should you have any questions/concerns: Marchia Bond, Cardiac Imaging Nurse Navigator Burley Saver, Interim Cardiac Imaging Nurse [place] and V[facility] Services Direct Office Dial: [phone]   For scheduling needs, including cancellations and rescheduling, please call [place], [phone].       Signed, Donato Heinz, MD  09/30/2020 6:06 PM    [facility]

## 2020-09-30 ENCOUNTER — Other Ambulatory Visit: Payer: Self-pay

## 2020-09-30 ENCOUNTER — Ambulatory Visit (INDEPENDENT_AMBULATORY_CARE_PROVIDER_SITE_OTHER): Payer: 59 | Admitting: Cardiology

## 2020-09-30 ENCOUNTER — Encounter: Payer: Self-pay | Admitting: Cardiology

## 2020-09-30 VITALS — BP 146/76 | HR 72 | Ht 65.5 in | Wt 193.0 lb

## 2020-09-30 DIAGNOSIS — R0600 Dyspnea, unspecified: Secondary | ICD-10-CM | POA: Diagnosis not present

## 2020-09-30 DIAGNOSIS — G4733 Obstructive sleep apnea (adult) (pediatric): Secondary | ICD-10-CM

## 2020-09-30 DIAGNOSIS — R072 Precordial pain: Secondary | ICD-10-CM | POA: Diagnosis not present

## 2020-09-30 DIAGNOSIS — Z79899 Other long term (current) drug therapy: Secondary | ICD-10-CM | POA: Diagnosis not present

## 2020-09-30 DIAGNOSIS — I10 Essential (primary) hypertension: Secondary | ICD-10-CM

## 2020-09-30 DIAGNOSIS — R0609 Other forms of dyspnea: Secondary | ICD-10-CM

## 2020-09-30 DIAGNOSIS — Z01812 Encounter for preprocedural laboratory examination: Secondary | ICD-10-CM

## 2020-09-30 DIAGNOSIS — E785 Hyperlipidemia, unspecified: Secondary | ICD-10-CM

## 2020-09-30 MED ORDER — METOPROLOL TARTRATE 100 MG PO TABS: ORAL_TABLET | ORAL | 0 refills | Status: AC

## 2020-09-30 MED ORDER — AMLODIPINE BESYLATE 5 MG PO TABS: 5.0000 mg | ORAL_TABLET | Freq: Every day | ORAL | 3 refills | Status: DC

## 2020-09-30 NOTE — Patient Instructions (Addendum)
Medication Instructions:  Start Amlodipine 5 mg daily  Please purchase Omron blood pressure machine Check blood pressure twice a day for 2 weeks, then call office with readings.   *If you need a refill on your cardiac medications before your next appointment, please call your pharmacy*   Lab Work: Your physician recommends lab work today (BMP, Lipid).   If you have labs (blood work) drawn today and your tests are completely normal, you will receive your results only by: Marland Kitchen MyChart Message (if you have MyChart) OR . A paper copy in the mail If you have any lab test that is abnormal or we need to change your treatment, we will call you to review the results.   Testing/Procedures: Your physician has requested that you have an echocardiogram. Echocardiography is a painless test that uses sound waves to create images of your heart. It provides your doctor with information about the size and shape of your heart and how well your heart's chambers and valves are working. This procedure takes approximately one hour. There are no restrictions for this procedure. [address]  Cardiac CT Angiography (CTA), is a special type of CT scan that uses a computer to produce multi-dimensional views of major blood vessels throughout the body. In CT angiography, a contrast material is injected through an IV to help visualize the blood vessels [facility]  Follow-Up: At [facility], you and your health needs are our priority.  As part of our continuing mission to provide you with exceptional heart care, we have created designated Provider Care Teams.  These Care Teams include your primary Cardiologist (physician) and Advanced Practice Providers (APPs -  Physician Assistants and Nurse Practitioners) who all work together to provide you with the care you need, when you need it.  We recommend signing up for the patient portal called "MyChart".  Sign up information is provided on this  After Visit Summary.  MyChart is used to connect with patients for Virtual Visits (Telemedicine).  Patients are able to view lab/test results, encounter notes, upcoming appointments, etc.  Non-urgent messages can be sent to your provider as well.   To learn more about what you can do with MyChart, go to [email or web address].    Your next appointment:   3 month(s)  The format for your next appointment:   In Person  Provider:   Oswaldo Milian, MD  Your cardiac CT will be scheduled at one of the below locations:   [facility] [address] [place], [place] [address] [phone]  If scheduled at [facility], please arrive at the [facility] main entrance of [facility] 30 minutes prior to test start time. Proceed to the [facility] Radiology Department (first floor) to check-in and test prep.  Please follow these instructions carefully (unless otherwise directed):  Hold all erectile dysfunction medications at least 3 days (72 hrs) prior to test.  On the Night Before the Test: . Be sure to Drink plenty of water. . Do not consume any caffeinated/decaffeinated beverages or chocolate 12 hours prior to your test. . Do not take any antihistamines 12 hours prior to your test.   On the Day of the Test: . Drink plenty of water. Do not drink any water within one hour of the test. . Do not eat any food 4 hours prior to the test. . You may take your regular medications prior to the test.  . Take metoprolol (Lopressor) 100 mg two  hours prior to test.        After the Test: . Drink plenty of water. . After receiving IV contrast, you may experience a mild flushed feeling. This is normal. . On occasion, you may experience a mild rash up to 24 hours after the test. This is not dangerous. If this occurs, you can take Benadryl 25 mg and increase your fluid intake. . If you experience trouble breathing, this can be serious. If it is severe call 911  IMMEDIATELY. If it is mild, please call our office. . If you take any of these medications: Glipizide/Metformin, Avandament, Glucavance, please do not take 48 hours after completing test unless otherwise instructed.   Once we have confirmed authorization from your insurance company, we will call you to set up a date and time for your test. Based on how quickly your insurance processes prior authorizations requests, please allow up to 4 weeks to be contacted for scheduling your Cardiac CT appointment. Be advised that routine Cardiac CT appointments could be scheduled as many as 8 weeks after your provider has ordered it.  For non-scheduling related questions, please contact the cardiac imaging nurse navigator should you have any questions/concerns: Marchia Bond, Cardiac Imaging Nurse Navigator Burley Saver, Interim Cardiac Imaging Nurse [place] and Vascular Services Direct Office Dial: [phone]   For scheduling needs, including cancellations and rescheduling, please call [place], [phone].

## 2020-10-01 LAB — BASIC METABOLIC PANEL
BUN/Creatinine Ratio: 19 (ref 10–24)
BUN: 23 mg/dL (ref 8–27)
CO2: 22 mmol/L (ref 20–29)
Calcium: 9.3 mg/dL (ref 8.6–10.2)
Chloride: 104 mmol/L (ref 96–106)
Creatinine, Ser: 1.22 mg/dL (ref 0.76–1.27)
GFR calc Af Amer: 74 mL/min/{1.73_m2} (ref >59)
GFR calc non Af Amer: 64 mL/min/{1.73_m2} (ref >59)
Glucose: 122 mg/dL — ABNORMAL HIGH (ref 65–99)
Potassium: 4.2 mmol/L (ref 3.5–5.2)
Sodium: 139 mmol/L (ref 134–144)

## 2020-10-01 LAB — LIPID PANEL
Chol/HDL Ratio: 4.1 ratio (ref 0.0–5.0)
Cholesterol, Total: 150 mg/dL (ref 100–199)
HDL: 37 mg/dL — ABNORMAL LOW (ref >39)
LDL Chol Calc (NIH): 73 mg/dL (ref 0–99)
Triglycerides: 242 mg/dL — ABNORMAL HIGH (ref 0–149)
VLDL Cholesterol Cal: 40 mg/dL (ref 5–40)

## 2020-10-19 ENCOUNTER — Other Ambulatory Visit (HOSPITAL_COMMUNITY): Payer: 59

## 2020-11-10 ENCOUNTER — Other Ambulatory Visit: Payer: Self-pay

## 2020-11-10 ENCOUNTER — Ambulatory Visit (HOSPITAL_COMMUNITY): Payer: 59 | Attending: Cardiovascular Disease

## 2020-11-10 DIAGNOSIS — R072 Precordial pain: Secondary | ICD-10-CM

## 2020-11-10 LAB — ECHOCARDIOGRAM COMPLETE
Area-P 1/2: 2.99 cm2
S' Lateral: 3.1 cm

## 2020-11-13 ENCOUNTER — Other Ambulatory Visit: Payer: Self-pay

## 2020-11-13 ENCOUNTER — Telehealth (HOSPITAL_COMMUNITY): Payer: Self-pay | Admitting: *Deleted

## 2020-11-13 ENCOUNTER — Other Ambulatory Visit: Payer: Self-pay | Admitting: *Deleted

## 2020-11-13 ENCOUNTER — Telehealth: Payer: Self-pay | Admitting: *Deleted

## 2020-11-13 ENCOUNTER — Other Ambulatory Visit (HOSPITAL_COMMUNITY): Payer: Self-pay | Admitting: *Deleted

## 2020-11-13 DIAGNOSIS — R072 Precordial pain: Secondary | ICD-10-CM

## 2020-11-13 DIAGNOSIS — Z01812 Encounter for preprocedural laboratory examination: Secondary | ICD-10-CM

## 2020-11-13 NOTE — Telephone Encounter (Signed)
Attempted to call patient regarding upcoming cardiac CT appointment. °Left message on voicemail with name and callback number ° °Dellamae Rosamilia RN Navigator Cardiac Imaging °[facility] °[phone] Office °[phone] Cell ° °

## 2020-11-13 NOTE — Telephone Encounter (Signed)
Patient wanted to know result of echo.   Patient came by office wanting results - -results given - The patient has been notified of the result and verbalized understanding.  All questions (if any) were answered. Tobin Chad, RN 11/13/2020 3:52 PM

## 2020-11-13 NOTE — Telephone Encounter (Signed)
Pt returning call regarding upcoming cardiac imaging study; pt verbalizes understanding of appt date/time, parking situation and where to check in, pre-test NPO status and medications ordered, and verified current allergies; name and call back number provided for further questions should they arise  Jesse Brick RN Navigator Cardiac Imaging [facility] [phone] office [phone] cell  Pt aware to obtain labs prior to scan.  Pt to take 100mg  metoprolol tartrate 2hrs PTA.

## 2020-11-14 LAB — BASIC METABOLIC PANEL
BUN/Creatinine Ratio: 23 (ref 10–24)
BUN: 20 mg/dL (ref 8–27)
CO2: 22 mmol/L (ref 20–29)
Calcium: 9.7 mg/dL (ref 8.6–10.2)
Chloride: 101 mmol/L (ref 96–106)
Creatinine, Ser: 0.86 mg/dL (ref 0.76–1.27)
Glucose: 114 mg/dL — ABNORMAL HIGH (ref 65–99)
Potassium: 4.4 mmol/L (ref 3.5–5.2)
Sodium: 139 mmol/L (ref 134–144)
eGFR: 99 mL/min/{1.73_m2} (ref >59)

## 2020-11-17 ENCOUNTER — Ambulatory Visit (HOSPITAL_COMMUNITY)
Admission: RE | Admit: 2020-11-17 | Discharge: 2020-11-17 | Disposition: A | Discharge status: Home / Self Care | Payer: 59 | Source: Ambulatory Visit | Attending: Cardiology | Admitting: Cardiology

## 2020-11-17 ENCOUNTER — Other Ambulatory Visit: Payer: Self-pay

## 2020-11-17 ENCOUNTER — Encounter (HOSPITAL_COMMUNITY): Payer: Self-pay

## 2020-11-17 DIAGNOSIS — R072 Precordial pain: Secondary | ICD-10-CM | POA: Insufficient documentation

## 2020-11-17 DIAGNOSIS — Z006 Encounter for examination for normal comparison and control in clinical research program: Secondary | ICD-10-CM

## 2020-11-17 MED ORDER — NITROGLYCERIN 0.4 MG SL SUBL
0.8000 mg | SUBLINGUAL_TABLET | Freq: Once | SUBLINGUAL | Status: AC
  Administered 2020-11-17: 0.8 mg via SUBLINGUAL

## 2020-11-17 MED ORDER — NITROGLYCERIN 0.4 MG SL SUBL
SUBLINGUAL_TABLET | SUBLINGUAL | Status: AC
  Filled 2020-11-17: qty 2

## 2020-11-17 MED ORDER — IOHEXOL 350 MG/ML SOLN
100.0000 mL | Freq: Once | INTRAVENOUS | Status: AC | PRN
  Administered 2020-11-17: 100 mL via INTRAVENOUS

## 2020-11-17 NOTE — Research (Signed)
IDENTIFY Informed Consent                  Subject Name: Jesse Evans    Subject met inclusion and exclusion criteria.  The informed consent form, study requirements and expectations were reviewed with the subject and questions and concerns were addressed prior to the signing of the consent form.  The subject verbalized understanding of the trial requirements.  The subject agreed to participate in the IDENTIFY trial and signed the informed consent at 15:58PM on 11/17/20.  The informed consent was obtained prior to performance of any protocol-specific procedures for the subject.  A copy of the signed informed consent was given to the subject and a copy was placed in the subject's medical record.   Meade Maw, Naval architect

## 2020-11-19 ENCOUNTER — Telehealth: Payer: Self-pay | Admitting: Cardiology

## 2020-11-19 MED ORDER — ATORVASTATIN CALCIUM 80 MG PO TABS: 80.0000 mg | ORAL_TABLET | Freq: Every day | ORAL | 3 refills | Status: AC

## 2020-11-19 NOTE — Telephone Encounter (Signed)
Patient would like to discuss 11/17/20 CT results.

## 2020-11-19 NOTE — Telephone Encounter (Signed)
Dicussed CCTA results and recommendations with patient, verbalized understanding.     Updated rx sent to pharmacy.  Aware of follow up appt and to call sooner with any questions/concerns.

## 2021-01-04 ENCOUNTER — Ambulatory Visit: Payer: 59 | Admitting: Cardiology

## 2021-02-09 ENCOUNTER — Ambulatory Visit: Payer: 59 | Admitting: Cardiology

## 2021-02-23 ENCOUNTER — Telehealth: Payer: Self-pay

## 2021-02-23 DIAGNOSIS — Z006 Encounter for examination for normal comparison and control in clinical research program: Secondary | ICD-10-CM

## 2021-02-23 NOTE — Telephone Encounter (Signed)
Called patient for 90 day Identify phone call no answer, I left a voicemail stating the intent of the phone call and our call back number to be reached in our department. 

## 2021-03-01 ENCOUNTER — Telehealth: Payer: Self-pay | Admitting: *Deleted

## 2021-03-01 DIAGNOSIS — Z006 Encounter for examination for normal comparison and control in clinical research program: Secondary | ICD-10-CM

## 2021-03-01 NOTE — Telephone Encounter (Signed)
I called patient for 90-day Identify study phone call. I left message for patient to call me back. 

## 2021-10-12 ENCOUNTER — Other Ambulatory Visit (HOSPITAL_COMMUNITY): Payer: Self-pay | Admitting: Physician Assistant

## 2021-10-12 ENCOUNTER — Other Ambulatory Visit: Payer: Self-pay

## 2021-10-12 ENCOUNTER — Ambulatory Visit (HOSPITAL_COMMUNITY)
Admission: RE | Admit: 2021-10-12 | Discharge: 2021-10-12 | Disposition: A | Discharge status: Home / Self Care | Payer: 59 | Source: Ambulatory Visit | Attending: Physician Assistant | Admitting: Physician Assistant

## 2021-10-12 DIAGNOSIS — M25561 Pain in right knee: Secondary | ICD-10-CM | POA: Diagnosis present

## 2021-10-12 DIAGNOSIS — M545 Low back pain, unspecified: Secondary | ICD-10-CM | POA: Insufficient documentation

## 2021-10-12 DIAGNOSIS — M25562 Pain in left knee: Secondary | ICD-10-CM

## 2021-10-12 DIAGNOSIS — M25551 Pain in right hip: Secondary | ICD-10-CM

## 2021-10-12 DIAGNOSIS — M25552 Pain in left hip: Secondary | ICD-10-CM

## 2021-10-22 ENCOUNTER — Other Ambulatory Visit: Payer: Self-pay | Admitting: Cardiology

## 2022-01-23 ENCOUNTER — Other Ambulatory Visit: Payer: Self-pay | Admitting: Cardiology

## 2022-03-17 ENCOUNTER — Encounter: Payer: Self-pay | Admitting: *Deleted

## 2022-04-15 ENCOUNTER — Other Ambulatory Visit: Payer: Self-pay | Admitting: Cardiology

## 2022-07-13 ENCOUNTER — Other Ambulatory Visit: Payer: Self-pay | Admitting: Cardiology

## 2023-08-08 IMAGING — DX DG LUMBAR SPINE 2-3V
3 series · 3 of 3 positions shown · non-contrast
Comparison: None.

CLINICAL DATA: Chronic lower back pain, no known injury, initial
encounter.

EXAM:
LUMBAR SPINE - 2-3 VIEW

[l-spine ap]
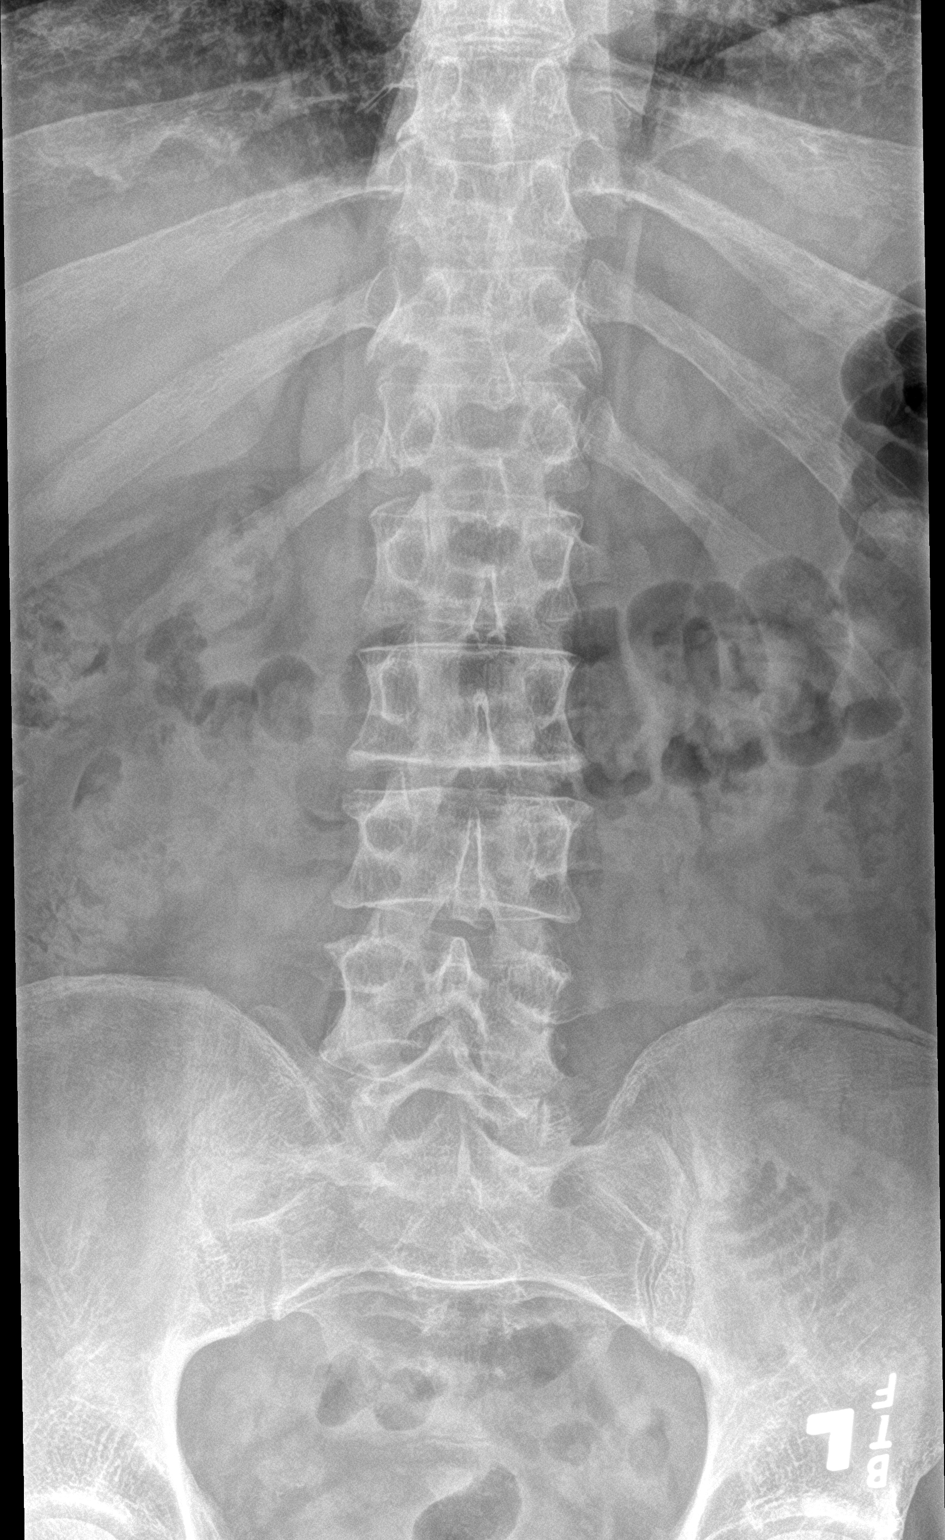

[l-spine lat]
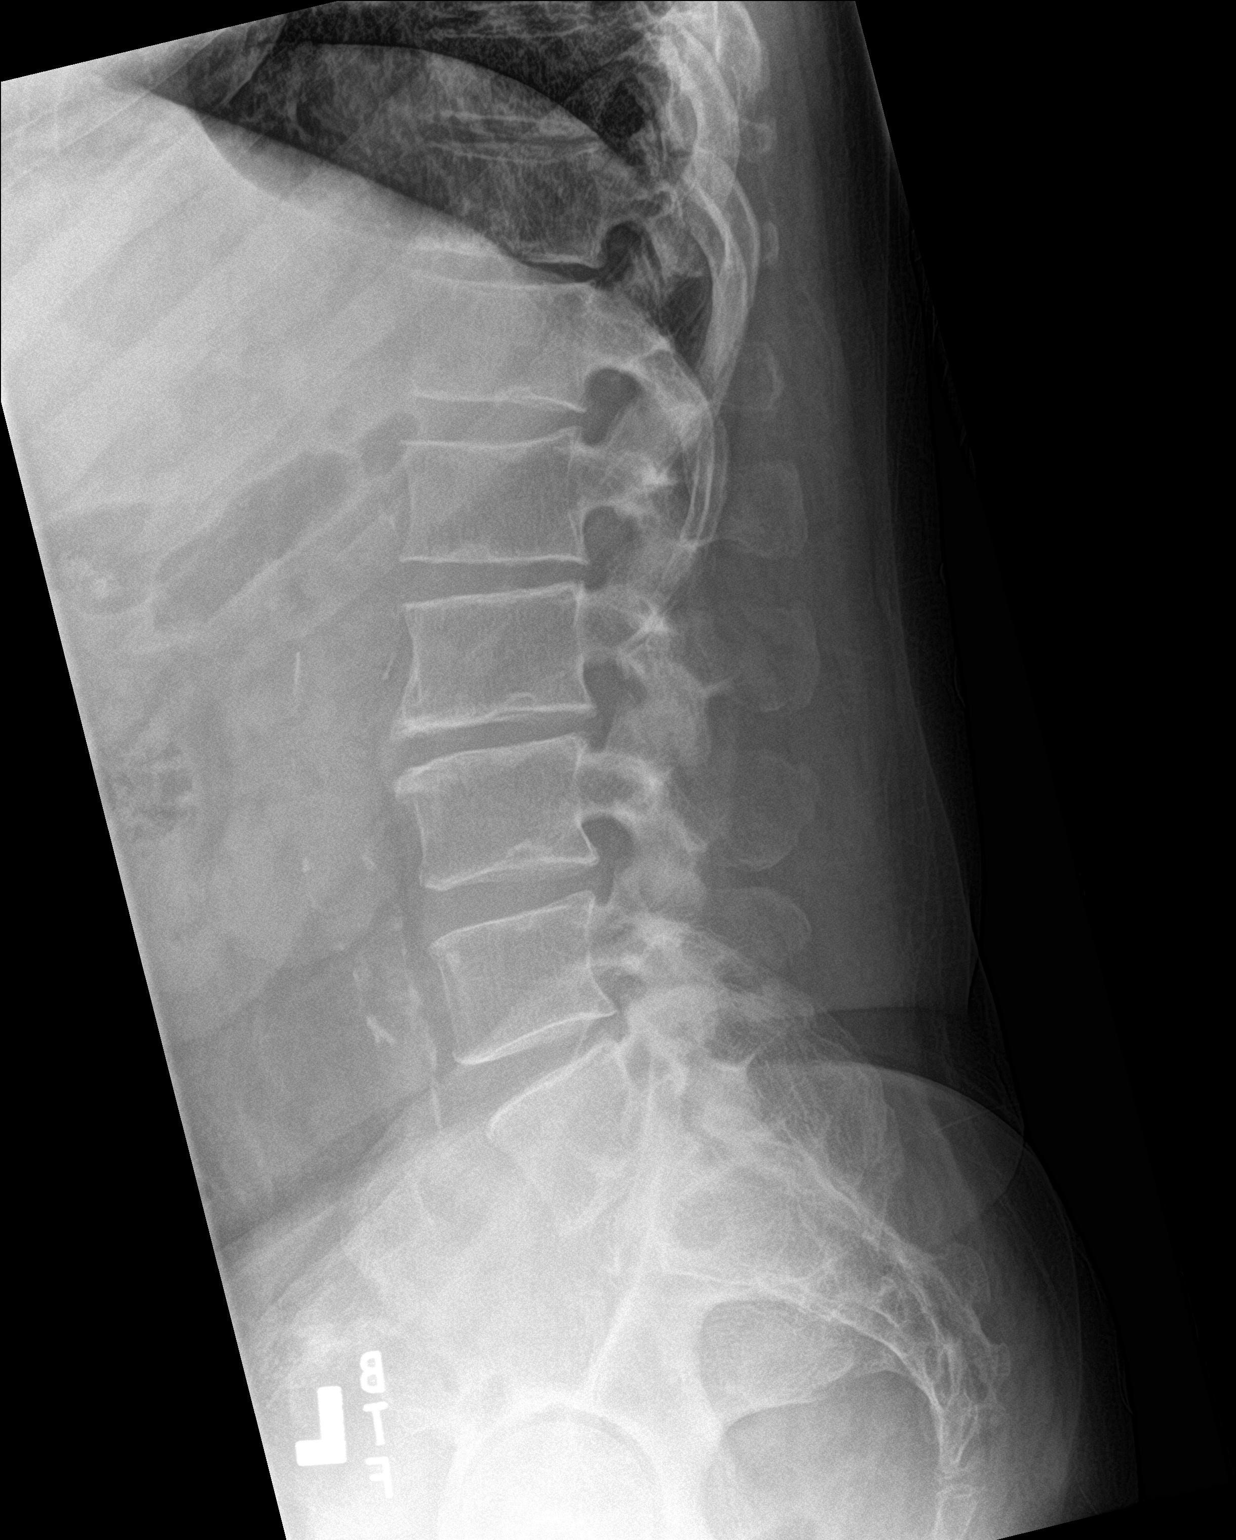

[l-spine spot]
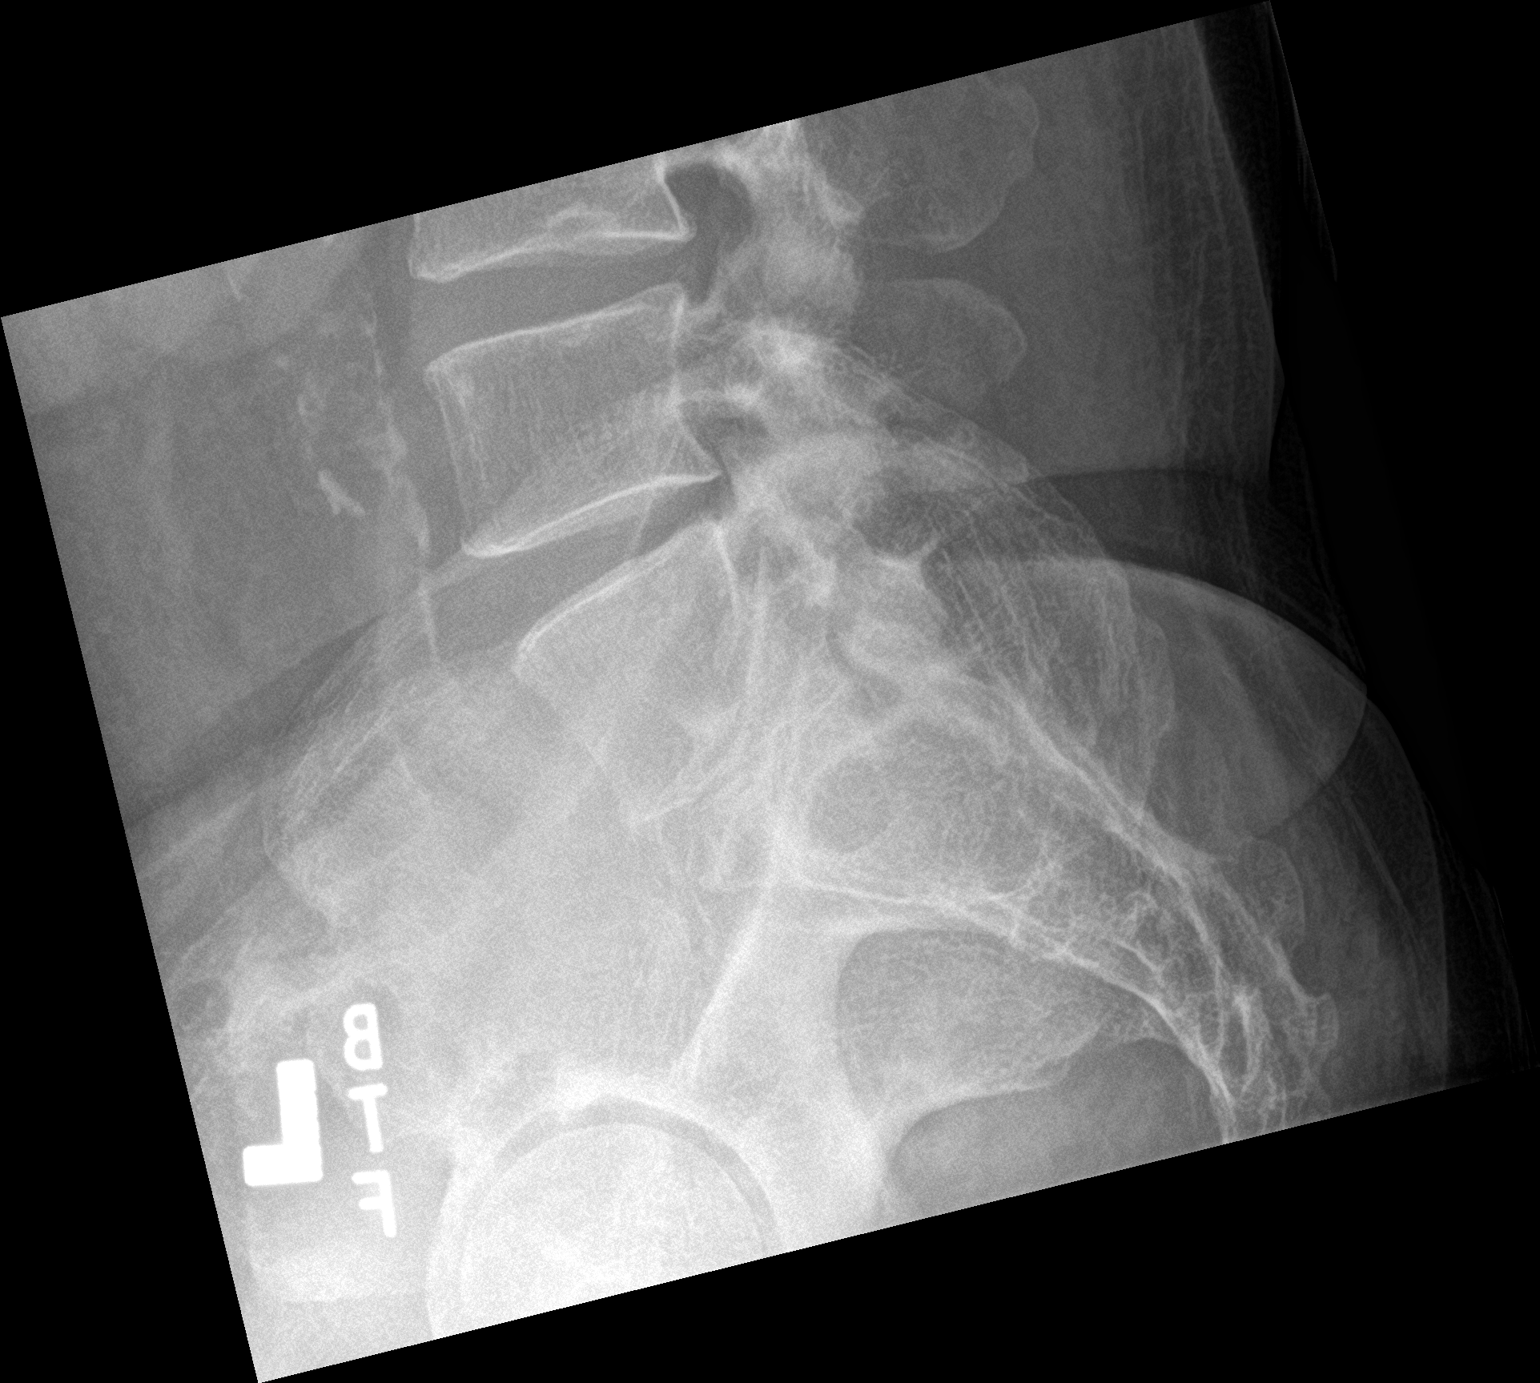

[3 of 3 positions shown; findings below may reference images not displayed]

FINDINGS: Five lumbar type vertebral bodies are well visualized. Fifth lumbar
vertebra is partially sacralized on the right. Mild osteophytic
changes are seen. Mild degenerative retrolisthesis of L4 on L5 is
noted. Mild osteophytic changes are noted. No compression deformity
is seen. Aortic calcifications are noted.
IMPRESSION: Degenerative change as described.

## 2023-08-08 IMAGING — DX DG HIP (WITH OR WITHOUT PELVIS) 5+V BILAT
5 series · 5 of 5 positions shown · non-contrast
Comparison: None.

CLINICAL DATA: Chronic hip pain, initial encounter

EXAM:
DG HIP (WITH OR WITHOUT PELVIS) 5+V BILAT

[pelvis ap]
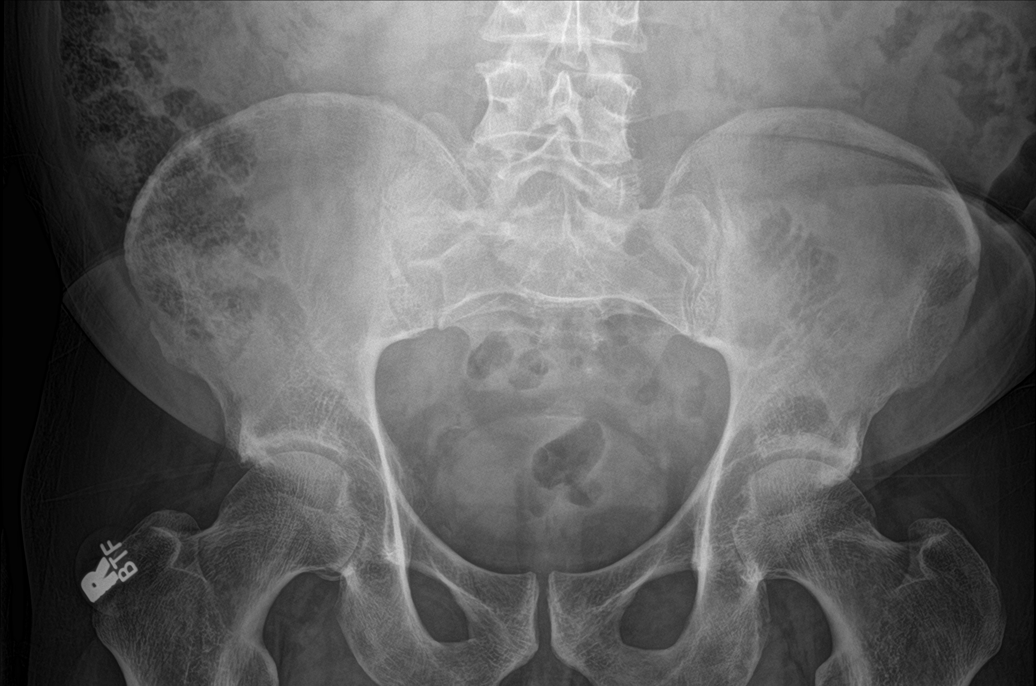

[hip ap (1 of 2)]
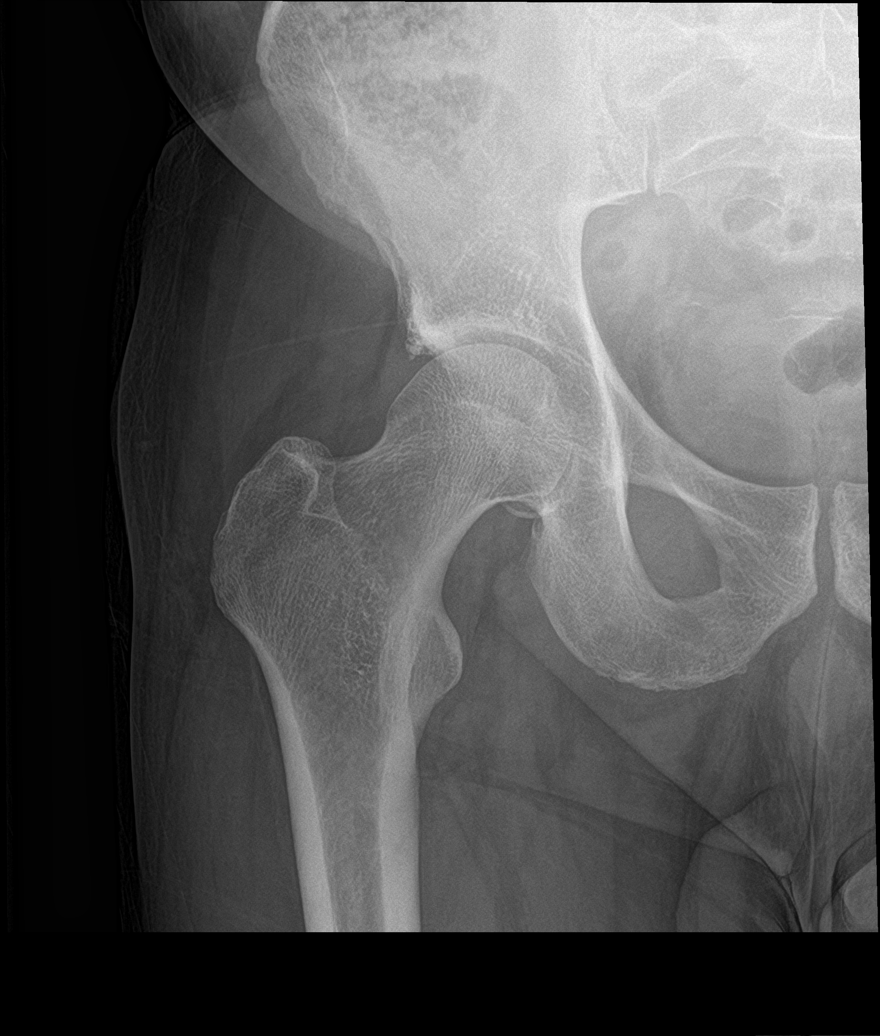

[hip lat (1 of 2)]
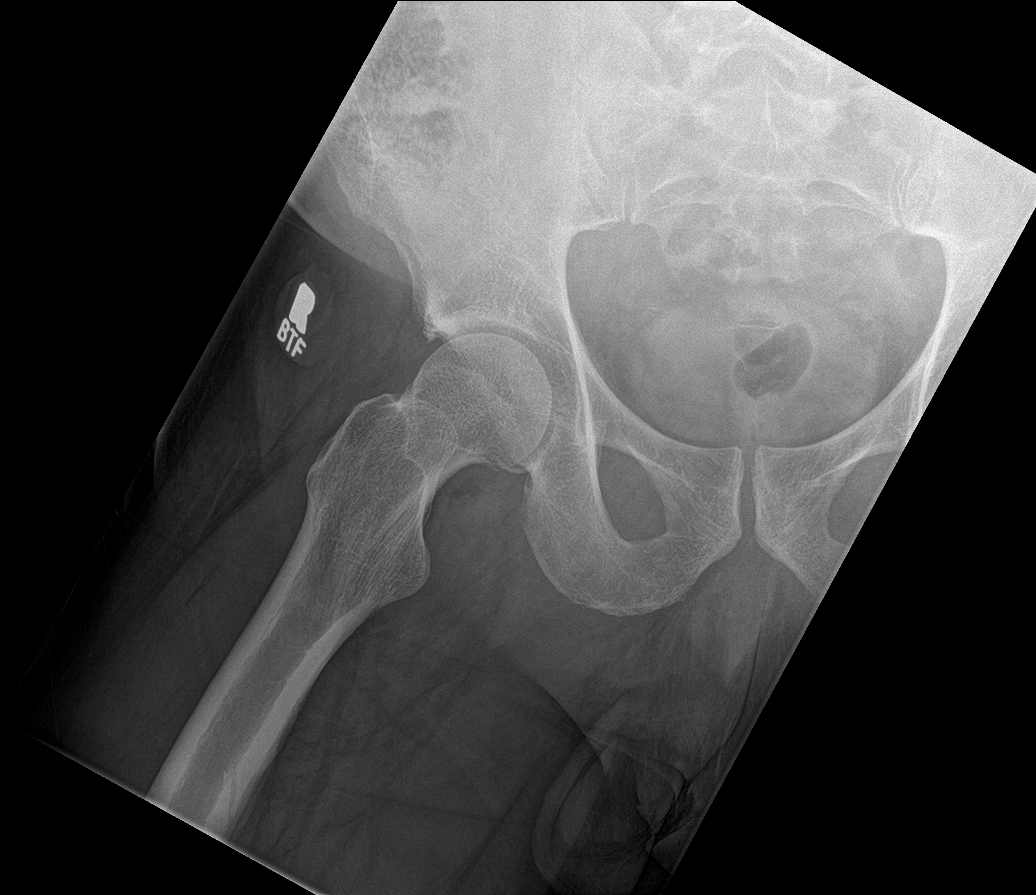

[hip ap (2 of 2)]
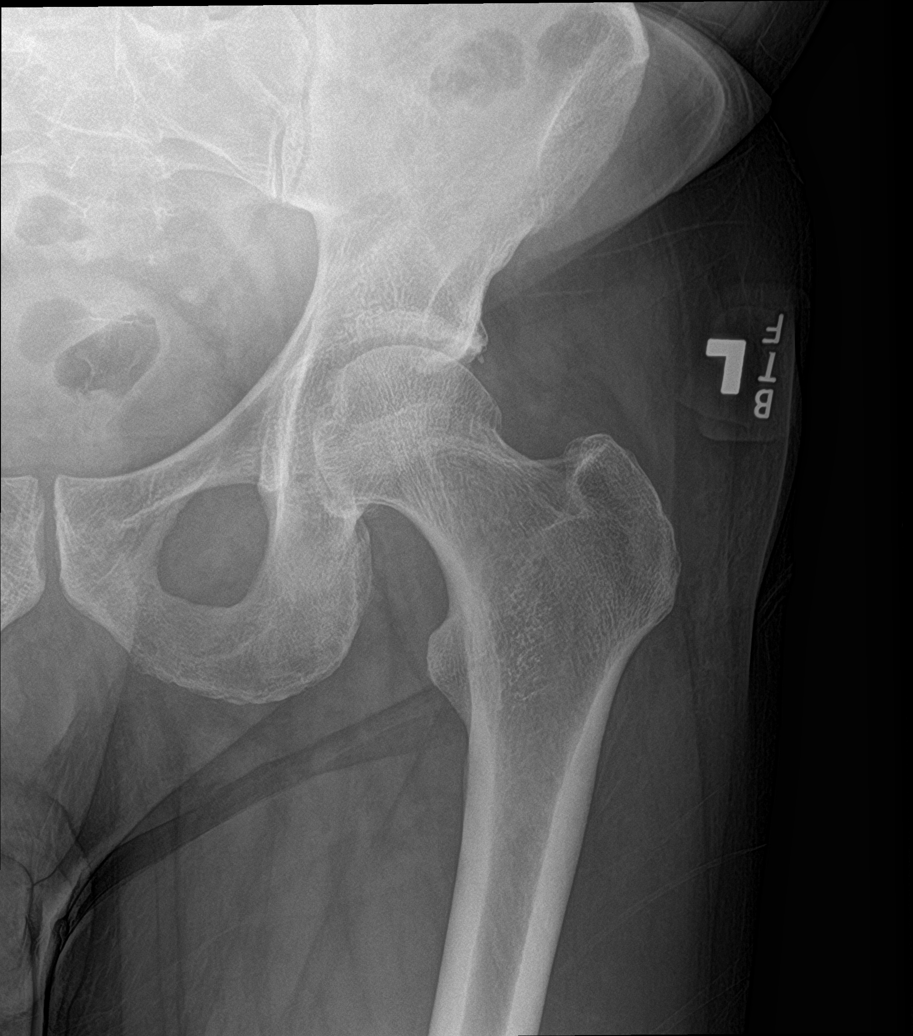

[hip lat (2 of 2)]
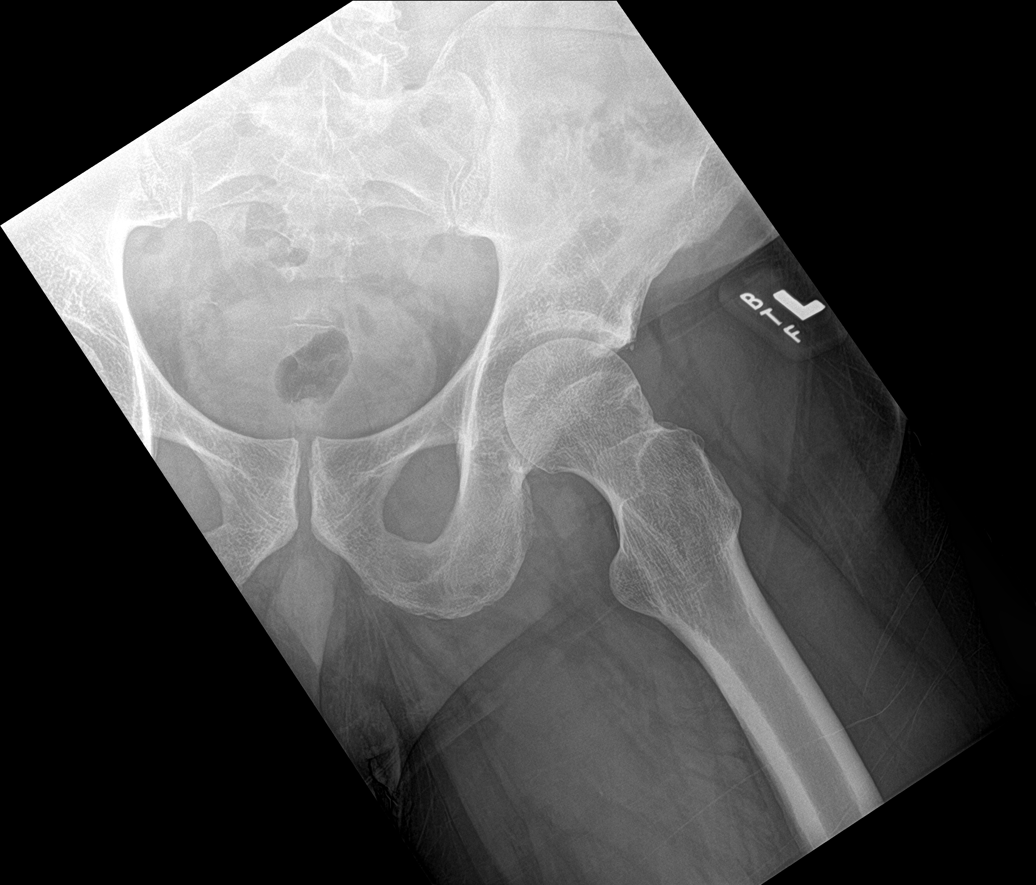

[5 of 5 positions shown; findings below may reference images not displayed]

FINDINGS: Pelvic ring is intact. Degenerative changes of the hip joints are
noted bilaterally. No acute fracture or dislocation is noted. No
soft tissue abnormality is seen.
IMPRESSION: Degenerative change without acute abnormality.

## 2023-09-09 ENCOUNTER — Other Ambulatory Visit: Payer: Self-pay | Admitting: Cardiology
